# Patient Record
Sex: Male | Born: 1965 | Race: White | Hispanic: No | Marital: Married
Health system: Midwestern US, Community
[De-identification: ages and names within clinical notes are randomized; demographics above are authoritative.]

## PROBLEM LIST (undated history)

## (undated) DIAGNOSIS — E119 Type 2 diabetes mellitus without complications: Secondary | ICD-10-CM

## (undated) DIAGNOSIS — I4891 Unspecified atrial fibrillation: Secondary | ICD-10-CM

## (undated) DIAGNOSIS — L719 Rosacea, unspecified: Secondary | ICD-10-CM

## (undated) DIAGNOSIS — G932 Benign intracranial hypertension: Secondary | ICD-10-CM

## (undated) DIAGNOSIS — I10 Essential (primary) hypertension: Secondary | ICD-10-CM

## (undated) DIAGNOSIS — E1165 Type 2 diabetes mellitus with hyperglycemia: Principal | ICD-10-CM

## (undated) DIAGNOSIS — I480 Paroxysmal atrial fibrillation: Secondary | ICD-10-CM

## (undated) DIAGNOSIS — Z7901 Long term (current) use of anticoagulants: Principal | ICD-10-CM

---

## 2016-02-18 ENCOUNTER — Emergency Department (HOSPITAL_COMMUNITY): Payer: Self-pay

## 2016-02-18 ENCOUNTER — Observation Stay (HOSPITAL_COMMUNITY)
Admission: EM | Admit: 2016-02-18 | Discharge: 2016-02-18 | Disposition: A | Discharge status: Home / Self Care | Payer: Self-pay | Attending: Internal Medicine | Admitting: Internal Medicine

## 2016-02-18 ENCOUNTER — Encounter (HOSPITAL_COMMUNITY): Payer: Self-pay | Admitting: Emergency Medicine

## 2016-02-18 DIAGNOSIS — J810 Acute pulmonary edema: Secondary | ICD-10-CM

## 2016-02-18 DIAGNOSIS — Z7984 Long term (current) use of oral hypoglycemic drugs: Secondary | ICD-10-CM | POA: Insufficient documentation

## 2016-02-18 DIAGNOSIS — D72829 Elevated white blood cell count, unspecified: Secondary | ICD-10-CM | POA: Insufficient documentation

## 2016-02-18 DIAGNOSIS — E119 Type 2 diabetes mellitus without complications: Secondary | ICD-10-CM | POA: Insufficient documentation

## 2016-02-18 DIAGNOSIS — Z6830 Body mass index (BMI) 30.0-30.9, adult: Secondary | ICD-10-CM | POA: Insufficient documentation

## 2016-02-18 DIAGNOSIS — I480 Paroxysmal atrial fibrillation: Principal | ICD-10-CM | POA: Insufficient documentation

## 2016-02-18 DIAGNOSIS — F419 Anxiety disorder, unspecified: Secondary | ICD-10-CM | POA: Insufficient documentation

## 2016-02-18 DIAGNOSIS — Z7901 Long term (current) use of anticoagulants: Secondary | ICD-10-CM | POA: Insufficient documentation

## 2016-02-18 DIAGNOSIS — Z79899 Other long term (current) drug therapy: Secondary | ICD-10-CM | POA: Insufficient documentation

## 2016-02-18 DIAGNOSIS — E669 Obesity, unspecified: Secondary | ICD-10-CM | POA: Insufficient documentation

## 2016-02-18 DIAGNOSIS — I10 Essential (primary) hypertension: Secondary | ICD-10-CM | POA: Diagnosis present

## 2016-02-18 DIAGNOSIS — I4891 Unspecified atrial fibrillation: Secondary | ICD-10-CM

## 2016-02-18 HISTORY — DX: Rosacea, unspecified: L71.9

## 2016-02-18 HISTORY — DX: Essential (primary) hypertension: I10

## 2016-02-18 HISTORY — DX: Type 2 diabetes mellitus without complications: E11.9

## 2016-02-18 HISTORY — DX: Unspecified atrial fibrillation: I48.91

## 2016-02-18 HISTORY — DX: Benign intracranial hypertension: G93.2

## 2016-02-18 LAB — I-STAT CHEM 8, ED
BUN: 23 mg/dL — ABNORMAL HIGH (ref 6–20)
CHLORIDE: 103 mmol/L (ref 101–111)
Calcium, Ion: 1.16 mmol/L (ref 1.15–1.40)
Creatinine, Ser: 0.9 mg/dL (ref 0.61–1.24)
GLUCOSE: 212 mg/dL — AB (ref 65–99)
HCT: 46 % (ref 39.0–52.0)
Hemoglobin: 15.6 g/dL (ref 13.0–17.0)
POTASSIUM: 4.3 mmol/L (ref 3.5–5.1)
Sodium: 138 mmol/L (ref 135–145)
TCO2: 25 mmol/L (ref 0–100)

## 2016-02-18 LAB — CBC WITH DIFFERENTIAL/PLATELET
Basophils Absolute: 0 10*3/uL (ref 0.0–0.1)
Basophils Relative: 0 %
EOS PCT: 1 %
Eosinophils Absolute: 0.1 10*3/uL (ref 0.0–0.7)
HEMATOCRIT: 43.2 % (ref 39.0–52.0)
HEMOGLOBIN: 15.3 g/dL (ref 13.0–17.0)
LYMPHS PCT: 40 %
Lymphs Abs: 5.2 10*3/uL — ABNORMAL HIGH (ref 0.7–4.0)
MCH: 28.7 pg (ref 26.0–34.0)
MCHC: 35.4 g/dL (ref 30.0–36.0)
MCV: 80.9 fL (ref 78.0–100.0)
MONO ABS: 1 10*3/uL (ref 0.1–1.0)
MONOS PCT: 8 %
NEUTROS PCT: 51 %
Neutro Abs: 6.6 10*3/uL (ref 1.7–7.7)
PLATELETS: 320 10*3/uL (ref 150–400)
RBC: 5.34 MIL/uL (ref 4.22–5.81)
RDW: 13.9 % (ref 11.5–15.5)
WBC: 12.9 10*3/uL — AB (ref 4.0–10.5)

## 2016-02-18 LAB — URINALYSIS, ROUTINE W REFLEX MICROSCOPIC
Bilirubin Urine: NEGATIVE
GLUCOSE, UA: NEGATIVE mg/dL
HGB URINE DIPSTICK: NEGATIVE
KETONES UR: NEGATIVE mg/dL
LEUKOCYTES UA: NEGATIVE
Nitrite: NEGATIVE
PROTEIN: NEGATIVE mg/dL
Specific Gravity, Urine: 1.007 (ref 1.005–1.030)
pH: 5 (ref 5.0–8.0)

## 2016-02-18 LAB — BRAIN NATRIURETIC PEPTIDE: B NATRIURETIC PEPTIDE 5: 41.5 pg/mL (ref 0.0–100.0)

## 2016-02-18 LAB — I-STAT TROPONIN, ED: TROPONIN I, POC: 0 ng/mL (ref 0.00–0.08)

## 2016-02-18 LAB — PROTIME-INR
INR: 0.92
Prothrombin Time: 12.3 seconds (ref 11.4–15.2)

## 2016-02-18 LAB — GLUCOSE, CAPILLARY: GLUCOSE-CAPILLARY: 215 mg/dL — AB (ref 65–99)

## 2016-02-18 MED ORDER — DOXYCYCLINE HYCLATE 100 MG PO TABS: 100.0000 mg | ORAL_TABLET | Freq: Two times a day (BID) | ORAL | 0 refills | Status: AC

## 2016-02-18 MED ORDER — LEVOFLOXACIN IN D5W 500 MG/100ML IV SOLN
500.0000 mg | Freq: Once | INTRAVENOUS | Status: AC
  Administered 2016-02-18: 500 mg via INTRAVENOUS
  Filled 2016-02-18: qty 100

## 2016-02-18 MED ORDER — FUROSEMIDE 10 MG/ML IJ SOLN
40.0000 mg | Freq: Once | INTRAMUSCULAR | Status: AC
  Administered 2016-02-18: 40 mg via INTRAVENOUS
  Filled 2016-02-18: qty 4

## 2016-02-18 MED ORDER — INSULIN ASPART 100 UNIT/ML ~~LOC~~ SOLN
0.0000 [IU] | Freq: Three times a day (TID) | SUBCUTANEOUS | Status: DC
  Administered 2016-02-18: 5 [IU] via SUBCUTANEOUS

## 2016-02-18 MED ORDER — SERTRALINE HCL 50 MG PO TABS: 50.0000 mg | ORAL_TABLET | Freq: Every day | ORAL | Status: DC

## 2016-02-18 MED ORDER — DILTIAZEM HCL-DEXTROSE 100-5 MG/100ML-% IV SOLN (PREMIX)
5.0000 mg/h | INTRAVENOUS | Status: DC
  Administered 2016-02-18: 5 mg/h via INTRAVENOUS
  Filled 2016-02-18: qty 100

## 2016-02-18 MED ORDER — METOPROLOL TARTRATE 5 MG/5ML IV SOLN
2.5000 mg | INTRAVENOUS | Status: AC
  Administered 2016-02-18 (×4): 2.5 mg via INTRAVENOUS
  Filled 2016-02-18 (×2): qty 5

## 2016-02-18 MED ORDER — INSULIN ASPART 100 UNIT/ML ~~LOC~~ SOLN: 0.0000 [IU] | Freq: Every day | SUBCUTANEOUS | Status: DC

## 2016-02-18 MED ORDER — METOPROLOL SUCCINATE ER 100 MG PO TB24
100.0000 mg | ORAL_TABLET | Freq: Every day | ORAL | Status: DC
  Administered 2016-02-18: 100 mg via ORAL
  Filled 2016-02-18: qty 1

## 2016-02-18 MED ORDER — DOXYCYCLINE HYCLATE 100 MG PO TABS
100.0000 mg | ORAL_TABLET | Freq: Two times a day (BID) | ORAL | Status: DC
  Administered 2016-02-18: 100 mg via ORAL
  Filled 2016-02-18: qty 1

## 2016-02-18 MED ORDER — SODIUM CHLORIDE 0.9 % IV BOLUS (SEPSIS)
1000.0000 mL | Freq: Once | INTRAVENOUS | Status: AC
  Administered 2016-02-18: 1000 mL via INTRAVENOUS

## 2016-02-18 MED ORDER — APIXABAN 5 MG PO TABS
5.0000 mg | ORAL_TABLET | Freq: Two times a day (BID) | ORAL | Status: DC
  Administered 2016-02-18: 5 mg via ORAL
  Filled 2016-02-18: qty 1

## 2016-02-18 MED ORDER — DILTIAZEM LOAD VIA INFUSION
15.0000 mg | Freq: Once | INTRAVENOUS | Status: AC
  Administered 2016-02-18: 15 mg via INTRAVENOUS
  Filled 2016-02-18: qty 15

## 2016-02-18 MED ORDER — LISINOPRIL 10 MG PO TABS
10.0000 mg | ORAL_TABLET | Freq: Every day | ORAL | Status: DC
  Administered 2016-02-18: 10 mg via ORAL
  Filled 2016-02-18: qty 1

## 2016-02-18 MED ORDER — METOPROLOL SUCCINATE ER 25 MG PO TB24: 25.0000 mg | ORAL_TABLET | Freq: Every day | ORAL | 0 refills | Status: AC

## 2016-02-18 MED ORDER — BUPROPION HCL ER (XL) 150 MG PO TB24
150.0000 mg | ORAL_TABLET | Freq: Every day | ORAL | Status: DC
  Administered 2016-02-18: 150 mg via ORAL
  Filled 2016-02-18: qty 1

## 2016-02-18 NOTE — ED Provider Notes (Addendum)
[facility]-EMERGENCY DEPT Provider Note   CSN: [id number] Arrival date & time: 02/18/16  0246  By signing my name below, I, Majel Homer, attest that this documentation has been prepared under the direction and in the presence of Shauntell Iglesia, MD . Electronically Signed: Majel Homer, Scribe. 02/18/2016. 3:03 AM.  History   Chief Complaint Chief Complaint  Patient presents with  . Atrial Fibrillation   The history is provided by the patient. No language interpreter was used.   HPI Comments: Dalton Morrison is a 50 y.o. male with PMHx of A-Fib who presents to the Emergency Department complaining of gradually worsening, sensation of increased heart rate that began ~2 hours PTA. Pt reports he arrived on vacation from [place] last night and began to experience palpitations this evening. He notes he has been hospitalized for similar episodes in the past and was usually travelling on vacation during onset. He states this has happened in the past 4 months. He notes a recent episode was triggered by EtOH but he denies drinking tonight.   No past medical history on file.  There are no active problems to display for this patient.  No past surgical history on file.  Irregularly irregular heart beat  Tachycardia   Home Medications    Prior to Admission medications   Not on File    Family History No family history on file.  Social History Social History  Substance Use Topics  . Smoking status: Not on file  . Smokeless tobacco: Not on file  . Alcohol use Not on file   Allergies   Review of patient's allergies indicates not on file.  Review of Systems Review of Systems  Constitutional: Negative for fever.  Cardiovascular: Positive for palpitations.  All other systems reviewed and are negative.  Physical Exam Updated Vital Signs BP 94/70 (BP Location: Right Arm) Comment: RN aware  Pulse (!) 151   Temp 98.2 F (36.8 C) (Oral)   Resp 20   Ht 5\' 9"  (1.753 m)   Wt 227 lb (103 kg)    SpO2 100%   BMI 33.52 kg/m   Physical Exam  Constitutional: He appears well-developed and well-nourished.  HENT:  Head: Normocephalic and atraumatic.  Mouth/Throat: Oropharynx is clear and moist. No oropharyngeal exudate.  Eyes: Conjunctivae and EOM are normal. Pupils are equal, round, and reactive to light. Right eye exhibits no discharge. Left eye exhibits no discharge. No scleral icterus.  Neck: Normal range of motion. Neck supple. No JVD present. No tracheal deviation present.  Trachea is midline. No stridor or carotid bruits.  Cardiovascular: Intact distal pulses.  An irregularly irregular rhythm present. Tachycardia present.   No murmur heard. Irregularly irregular heart rate  Pulmonary/Chest: Effort normal and breath sounds normal. No accessory muscle usage or stridor. No respiratory distress. He has no wheezes. He has no rales.  Abdominal: Soft. Bowel sounds are normal. He exhibits no distension. There is no tenderness. There is no rebound and no guarding.  Musculoskeletal: Normal range of motion. He exhibits no edema or tenderness.  Lymphadenopathy:    He has no cervical adenopathy.  Neurological: He is alert. He has normal reflexes.  Skin: Skin is warm and dry. Capillary refill takes less than 2 seconds. He is not diaphoretic.  Psychiatric: He has a normal mood and affect. His behavior is normal.  Nursing note and vitals reviewed.  ED Treatments / Results  Labs (all labs ordered are listed, but only abnormal results are displayed) Labs Reviewed - No data  to display  EKG  EKG Interpretation None       Radiology Dg Chest [facility] 1 View  Result Date: 02/18/2016 CLINICAL DATA:  Heart palpitations, atrial fibrillation EXAM: PORTABLE CHEST 1 VIEW COMPARISON:  None. FINDINGS: There is shallow lung inflation. Costophrenic angles are not completely visualized, but there is no sizable pleural effusion. No pneumothorax. Cardiac silhouette appears mildly enlarged, though this may  be exaggerated by AP technique. There is mild pulmonary edema. There is a region of more focal consolidation within the medial right upper lobe. IMPRESSION: 1. Mild cardiomegaly with mild pulmonary edema. 2. More focal region of consolidation in the right upper lobe, which may indicate atelectasis or developing infection. Electronically Signed   By: Deatra Robinson M.D.   On: 02/18/2016 03:44   Procedures Procedures  DIAGNOSTIC STUDIES:  Oxygen Saturation is 100% on RA, normal by my interpretation.    COORDINATION OF CARE:  3:01 AM Discussed treatment plan with pt at bedside and pt agreed to plan.  Medications Ordered in ED Medications - No data to display   Initial Impression / Assessment and Plan / ED Course  I have reviewed the triage vital signs and the nursing notes.  Pertinent labs & imaging results that were available during my care of the patient were reviewed by me and considered in my medical decision making (see chart for details).  Clinical Course   Vitals:   02/18/16 0300  BP: 94/70  Pulse: (!) 151  Resp: 20  Temp: 98.2 F (36.8 C)    I personally performed the services described in this documentation, which was scribed in my presence. The recorded information has been reviewed and is accurate.   Final Clinical Impressions(s) / ED Diagnoses   Final diagnoses:  None    New Prescriptions New Prescriptions   No medications on file  ED ECG REPORT   Date: 02/18/2016  Rate: 156  Rhythm: atrial fibrillation  QRS Axis: normal  Intervals: normal  ST/T Wave abnormalities: normal  Conduction Disutrbances:none  Narrative Interpretation:   Old EKG Reviewed: none available Results for orders placed or performed during the hospital encounter of 02/18/16  CBC with Differential/Platelet  Result Value Ref Range   WBC 12.9 (H) 4.0 - 10.5 K/uL   RBC 5.34 4.22 - 5.81 MIL/uL   Hemoglobin 15.3 13.0 - 17.0 g/dL   HCT 81.1 91.4 - 78.2 %   MCV 80.9 78.0 - 100.0 fL   MCH  28.7 26.0 - 34.0 pg   MCHC 35.4 30.0 - 36.0 g/dL   RDW 95.6 21.3 - 08.6 %   Platelets 320 150 - 400 K/uL   Neutrophils Relative % 51 %   Lymphocytes Relative 40 %   Monocytes Relative 8 %   Eosinophils Relative 1 %   Basophils Relative 0 %   Neutro Abs 6.6 1.7 - 7.7 K/uL   Lymphs Abs 5.2 (H) 0.7 - 4.0 K/uL   Monocytes Absolute 1.0 0.1 - 1.0 K/uL   Eosinophils Absolute 0.1 0.0 - 0.7 K/uL   Basophils Absolute 0.0 0.0 - 0.1 K/uL   Smear Review MORPHOLOGY UNREMARKABLE   Protime-INR  Result Value Ref Range   Prothrombin Time 12.3 11.4 - 15.2 seconds   INR 0.92   I-Stat Chem 8, ED  Result Value Ref Range   Sodium 138 135 - 145 mmol/L   Potassium 4.3 3.5 - 5.1 mmol/L   Chloride 103 101 - 111 mmol/L   BUN 23 (H) 6 - 20 mg/dL  Creatinine, Ser 0.90 0.61 - 1.24 mg/dL   Glucose, Bld 295212 (H) 65 - 99 mg/dL   Calcium, Ion 2.841.16 1.321.15 - 1.40 mmol/L   TCO2 25 0 - 100 mmol/L   Hemoglobin 15.6 13.0 - 17.0 g/dL   HCT 44.046.0 10.239.0 - 72.552.0 %  I-stat troponin, ED  Result Value Ref Range   Troponin i, poc 0.00 0.00 - 0.08 ng/mL   Comment 3           Dg Chest Port 1 View  Result Date: 02/18/2016 CLINICAL DATA:  Heart palpitations, atrial fibrillation EXAM: PORTABLE CHEST 1 VIEW COMPARISON:  None. FINDINGS: There is shallow lung inflation. Costophrenic angles are not completely visualized, but there is no sizable pleural effusion. No pneumothorax. Cardiac silhouette appears mildly enlarged, though this may be exaggerated by AP technique. There is mild pulmonary edema. There is a region of more focal consolidation within the medial right upper lobe. IMPRESSION: 1. Mild cardiomegaly with mild pulmonary edema. 2. More focal region of consolidation in the right upper lobe, which may indicate atelectasis or developing infection. Electronically Signed   By: Deatra RobinsonKevin  Herman M.D.   On: 02/18/2016 03:44   Medications  diltiazem (CARDIZEM) 1 mg/mL load via infusion 15 mg (15 mg Intravenous Bolus from Bag 02/18/16 0329)     And  diltiazem (CARDIZEM) 100 mg in dextrose 5% 100mL (1 mg/mL) infusion (7.5 mg/hr Intravenous Rate/Dose Change 02/18/16 0401)  levofloxacin (LEVAQUIN) IVPB 500 mg (500 mg Intravenous New Bag/Given 02/18/16 0426)  sodium chloride 0.9 % bolus 1,000 mL (1,000 mLs Intravenous Rate/Dose Change 02/18/16 0403)  furosemide (LASIX) injection 40 mg (40 mg Intravenous Given 02/18/16 0413)   MDM Reviewed: vitals Interpretation: labs, ECG and x-ray (CHF by me on EKG, negative troponin by me elevated WBC by me) Total time providing critical care: 75-105 minutes. This excludes time spent performing separately reportable procedures and services. Consults: admitting MD  CRITICAL CARE Performed by: Jasmine AwePALUMBO-RASCH,Jeancarlo Leffler K Total critical care time: 90 minutes Critical care time was exclusive of separately billable procedures and treating other patients. Critical care was necessary to treat or prevent imminent or life-threatening deterioration. Critical care was time spent personally by me on the following activities: development of treatment plan with patient and/or surrogate as well as nursing, discussions with consultants, evaluation of patient's response to treatment, examination of patient, obtaining history from patient or surrogate, ordering and performing treatments and interventions, ordering and review of laboratory studies, ordering and review of radiographic studies, pulse oximetry and re-evaluation of patient's condition.  Will need admission as is diltiazem dependent. IVF was stopped, bolus was not completed.  Has been traveling around the country by plane.  Will need admission   Lametria Klunk, MD 02/18/16 [id number]    Cy BlamerApril Nawaf Strange, MD 02/18/16 [phone]

## 2016-02-18 NOTE — Progress Notes (Signed)
Completed D/C teaching. Gave prescriptions. Answered questions. Patient will be D/C home with family in stable condition. 

## 2016-02-18 NOTE — H&P (Signed)
History and Physical  Patient Name: Dalton Morrison     ZDG:[id number]    DOB: 1966/02/01    DOA: 02/18/2016 PCP: Fabiola Backer, MD, MD   Patient coming from: [facility]  Chief Complaint: Fast heart rate  HPI: Dalton Morrison is a 50 y.o. male with a past medical history significant for parox Afib on Eliquis, NIDDM, and HTN who presents with fast heart rate.  The patient lives in [place], [place] but travels frequently for work.  He has had atrial fibrillation paroxysmally over the last 2 years, is on Eliquis and metoprolol XL 50 mg daily.  During his last two episodes of tachycardia, both seemed to be precipitated by alcohol, and both times his rhythm has returned spontaneously to sinus rhythm before he was seen by an ER provider.    Tonight, he was at his hotel, had had no alcohol, and started to feel palpitations/heart racing.  He had no chest pain, no shortness of breath, no lightheadedness, and came to the ER.  ED course: -Afebrile, heart rate 150s, respirations normal, blood pressure initially 94/70, pulse oximetry normal -Na 138, K 4.3, Cr 0.9 (baseline per care everywhere), WBC 12.9 K, Hgb 15 -ECG showed Afib with rate 150s -BNP normal, troponin negative -Chest x-ray showed a questionable right upper lobe infiltrate, he denied cough, sputum, dyspnea, chest congestion -He did note a recent episode of urinary frequency and flank pain that resolved by itself 2 weeks ago, and then recurrence of flank pain and urinary irritative symptoms in the last 24 hours -Diltiazem IV was started and his heart rate improved to 100s and 110s and his low blood pressure resolved         ROS: Review of Systems  Constitutional: Negative for chills, diaphoresis, fever and malaise/fatigue.  Respiratory: Negative for cough, hemoptysis, sputum production and shortness of breath.   Cardiovascular: Positive for palpitations. Negative for chest pain and leg swelling.  Gastrointestinal: Negative for abdominal pain,  diarrhea, nausea and vomiting.  Genitourinary: Positive for dysuria, flank pain and frequency. Negative for hematuria and urgency.  All other systems reviewed and are negative.         Past Medical History:  Diagnosis Date  . A-fib (HCC)   . Acne rosacea   . Benign intracranial hypertension   . Diabetes mellitus without complication (HCC)   . Hypertension     History reviewed. No pertinent surgical history.  Social History: Patient lives with his wife in [place].  The patient walks unassisted.  He does smoke occasionally.  He uses alcohol rarely.    Allergies  Allergen Reactions  . Penicillins Hives and Other (See Comments)    Has patient had a PCN reaction causing immediate rash, facial/tongue/throat swelling, SOB or lightheadedness with hypotension: No Has patient had a PCN reaction causing severe rash involving mucus membranes or skin necrosis: No Has patient had a PCN reaction that required hospitalization No Has patient had a PCN reaction occurring within the last 10 years: No If all of the above answers are "NO", then may proceed with Cephalosporin use.    Family history: family history includes Alcohol abuse in his mother; Arthritis in his father; COPD in his mother; Depression in his mother; Diabetes in his father; Heart disease in his father; Hypertension in his father; Stroke in his father.  Prior to Admission medications   Medication Sig Start Date End Date Taking? Authorizing Provider  apixaban (ELIQUIS) 5 MG TABS tablet Take 5 mg by mouth 2 (two) times daily.  Yes Historical Provider, MD  buPROPion (WELLBUTRIN XL) 150 MG 24 hr tablet Take 150 mg by mouth daily.   Yes Historical Provider, MD  Liraglutide (VICTOZA) 18 MG/3ML SOPN Inject 1.8 mg into the skin daily.   Yes Historical Provider, MD  lisinopril (PRINIVIL,ZESTRIL) 10 MG tablet Take 10 mg by mouth daily.   Yes Historical Provider, MD  metFORMIN (GLUCOPHAGE-XR) 500 MG 24 hr tablet Take 500-1,000 mg by  mouth 2 (two) times daily. Pt takes two tablets in the morning and one at night.   Yes Historical Provider, MD  metoprolol succinate (TOPROL-XL) 50 MG 24 hr tablet Take 50 mg by mouth at bedtime. Take with or immediately following a meal.   Yes Historical Provider, MD  omeprazole (PRILOSEC) 20 MG capsule Take 20 mg by mouth daily.   Yes Historical Provider, MD  pravastatin (PRAVACHOL) 40 MG tablet Take 40 mg by mouth at bedtime.   Yes Historical Provider, MD  sertraline (ZOLOFT) 50 MG tablet Take 50 mg by mouth at bedtime.   Yes Historical Provider, MD       Physical Exam: BP 113/79   Pulse 103   Temp 98.2 F (36.8 C) (Oral)   Resp 11   Ht 5\' 9"  (1.753 m)   Wt 103 kg (227 lb)   SpO2 95%   BMI 33.52 kg/m  General appearance: Well-developed, adult male, alert and in no acute distress.   Eyes: Anicteric, conjunctiva pink, lids and lashes normal.     ENT: No nasal deformity, discharge, or epistaxis.  OP moist without lesions.   Lymph: No cervical or supraclavicular lymphadenopathy. Skin: Warm and dry.  No jaundice.  No suspicious rashes or lesions. Cardiac: RRR, nl S1-S2, no murmurs appreciated.  Capillary refill is brisk.  JVP normal.  No LE edema.  Radial and DP pulses 2+ and symmetric. Respiratory: Normal respiratory rate and rhythm.  CTAB without rales or wheezes. GI: Abdomen soft without rigidity.  No TTP. No ascites, distension, hepatosplenomegaly.   MSK: No deformities or effusions.  No clubbing/cyanosis. Neuro: Cranial nerves normal.  Sensorium intact and responding to questions, attention normal.  Speech is fluent.  Moves all extremities equally and with normal coordination.    Psych: Affect normal.  Judgment and insight appear normal.       Labs on Admission:  I have personally reviewed following labs and imaging studies: CBC:  Recent Labs Lab 02/18/16 0309 02/18/16 0322  WBC 12.9*  --   NEUTROABS 6.6  --   HGB 15.3 15.6  HCT 43.2 46.0  MCV 80.9  --   PLT 320  --     Basic Metabolic Panel:  Recent Labs Lab 02/18/16 0322  NA 138  K 4.3  CL 103  GLUCOSE 212*  BUN 23*  CREATININE 0.90   GFR: Estimated Creatinine Clearance: 116.1 mL/min (by C-G formula based on SCr of 0.9 mg/dL).  Coagulation Profile:  Recent Labs Lab 02/18/16 0309  INR 0.92        Radiological Exams on Admission: Personally reviewed: Dg Chest Port 1 View  Result Date: 02/18/2016 CLINICAL DATA:  Heart palpitations, atrial fibrillation EXAM: PORTABLE CHEST 1 VIEW COMPARISON:  None. FINDINGS: There is shallow lung inflation. Costophrenic angles are not completely visualized, but there is no sizable pleural effusion. No pneumothorax. Cardiac silhouette appears mildly enlarged, though this may be exaggerated by AP technique. There is mild pulmonary edema. There is a region of more focal consolidation within the medial right upper lobe. IMPRESSION: 1.  Mild cardiomegaly with mild pulmonary edema. 2. More focal region of consolidation in the right upper lobe, which may indicate atelectasis or developing infection. Electronically Signed   By: Deatra RobinsonKevin  Herman M.D.   On: 02/18/2016 03:44    EKG: Independently reviewed. Rate 156, atrial fibrillation, no significant ST depressions.    Assessment/Plan 1. pAF with RVR:  CHADS2Vasc 2 on Eliquis and metoprolol.  Follow[place] by [place]CP in [place].   -Metoprolol IV now -Repeat dose this morning until rate controlled -Plan to increase metoprolol to 100 mg daily at 10a today -Continue Eliquis   2. Leukocytosis: Unclear etiology.  No fever, cough, dyspnea, sputum.  No clinical findings to correlate with CXR, pneumonia doubted.  Levofloxacin discontinued.  I do not believe at present that the patient has sepsis. -Check UA given urinary frequency symptoms, culture if pyuria and start oral antibiotics for UTI  NIDDM:  -Hold Victoza and metformin -SSI with meals  3. HTN:  -Continue lisinopril  4. Anxiety:  -Continue Zoloft and  bupropion  5. Other medications: -Hold nonessential statin and PPI    DVT prophylaxis: On Eliquis  Code Status: FULL  Family Communication: None present  Disposition Plan: Anticipate IV metoprolol for rate control and then titration of oral metoprolol and dsicharge Consults called: None Admission status: OBS, tele At the point of initial evaluation, it is my clinical opinion that admission for OBSERVATION is reasonable and necessary because the patient's presenting complaints in the context of their chronic conditions represent sufficient risk of deterioration or significant morbidity to constitute reasonable grounds for close observation in the hospital setting, but that the patient may be medically stable for discharge from the hospital within 24 to 48 hours.    Medical decision making: Patient seen at 5:20 AM on 02/18/2016.  The patient was discussed with Dr. Nicanor AlconPalumbo. What exists of the patient's chart and [facility] were [facility].  Clinical condition: stable.        Alberteen SamChristopher P Kiyomi Pallo [facility] Pager [phone]

## 2016-02-18 NOTE — ED Triage Notes (Signed)
Pt states that he began having increased heart rate for the last 2 hrs; pt states that this has happened 4 times in the last 6 months

## 2016-02-18 NOTE — Discharge Summary (Signed)
Physician Discharge Summary  Dalton Morrison ZOX:[id number] DOB: 06-25-1965 DOA: 02/18/2016  PCP: Fabiola Backer, MD, MD  Admit date: 02/18/2016 Discharge date: 02/18/2016  Recommendations for Outpatient Follow-up:  1. Pt will need to follow up with PCP in 1-2 weeks post discharge 2. Please obtain BMP to evaluate electrolytes and kidney function 3. Please also check CBC to evaluate Hg and Hct levels 4. Pt advised to see his cardiologist for further evaluation of intermittent a-fib episodes  5. Please note that Metoprolol dose was increased from 50 --> 75 mg PO QD 6. Pt also given scrip for doxycycline to complete therapy for 5 days post discharge  7. This was also discussed with wife over the phone   Discharge Diagnoses:  Principal Problem:   Paroxysmal atrial fibrillation with RVR (HCC) Active Problems:   Controlled type 2 diabetes mellitus without complication, without long-term current use of insulin (HCC)   Anxiety   Essential hypertension   Leukocytosis   Atrial fibrillation (HCC)  Discharge Condition: Stable  Diet recommendation: Heart healthy diet discussed in details   History of present illness:  50 y.o. male with a past medical history significant for parox Afib on Eliquis, NIDDM, and HTN who presents with fast heart rate.  The patient lives in [place], [place] but travels frequently for work.  He has had atrial fibrillation paroxysmally over the last 2 years, is on Eliquis and metoprolol XL 50 mg daily.    Hospital Course:   Assessment/Plan pAF with RVR:  CHADS2Vasc 2 on Eliquis and metoprolol.  Followed by PCP in [place].   - Metoprolol dose increased to 75 mg PO QD - HR has been in 60's this AM  - Continue Eliquis  Leukocytosis: - suspect RUL PNA based on CXR - pt started on Doxycycline and will need to complete therapy upon discharge - UA clear with no evidence of an infectious etiology   NIDDM:  - resume home medical regimen   Essential HTN:   - continue home medical regimen   Obesity  - Body mass index is 30.17 kg/m.   DVT prophylaxis: On Eliquis  Code Status: FULL  Family Communication: Wife over the phone  Disposition Plan: Home   Procedures/Studies: Dg Chest Port 1 View  Result Date: 02/18/2016 CLINICAL DATA:  Heart palpitations, atrial fibrillation EXAM: PORTABLE CHEST 1 VIEW COMPARISON:  None. FINDINGS: There is shallow lung inflation. Costophrenic angles are not completely visualized, but there is no sizable pleural effusion. No pneumothorax. Cardiac silhouette appears mildly enlarged, though this may be exaggerated by AP technique. There is mild pulmonary edema. There is a region of more focal consolidation within the medial right upper lobe. IMPRESSION: 1. Mild cardiomegaly with mild pulmonary edema. 2. More focal region of consolidation in the right upper lobe, which may indicate atelectasis or developing infection. Electronically Signed   By: Deatra Robinson M.D.   On: 02/18/2016 03:44    Discharge Exam: Vitals:   02/18/16 0620 02/18/16 0648  BP: 125/84 119/69  Pulse:  60  Resp: 16 18  Temp:  97.9 F (36.6 C)   Vitals:   02/18/16 0610 02/18/16 0615 02/18/16 0620 02/18/16 0648  BP: 116/80 119/80 125/84 119/69  Pulse:    60  Resp: 15 18 16 18   Temp:    97.9 F (36.6 C)  TempSrc:    Oral  SpO2:    99%  Weight:    92.7 kg (204 lb 5.2 oz)  Height:    5\' 9"  (1.753  m)    General: Pt is alert, follows commands appropriately, not in acute distress Cardiovascular: Irregular rate and rhythm, no rubs, no gallops Respiratory: Clear to auscultation bilaterally, no wheezing, no crackles, no rhonchi   Discharge Instructions  Discharge Instructions    Diet - low sodium heart healthy    Complete by:  As directed   Increase activity slowly    Complete by:  As directed       Medication List    TAKE these medications   buPROPion 150 MG 24 hr tablet Commonly known as:  WELLBUTRIN XL Take 150 mg by mouth  daily.   ELIQUIS 5 MG Tabs tablet Generic drug:  apixaban Take 5 mg by mouth 2 (two) times daily.   lisinopril 10 MG tablet Commonly known as:  PRINIVIL,ZESTRIL Take 10 mg by mouth daily.   metFORMIN 500 MG 24 hr tablet Commonly known as:  GLUCOPHAGE-XR Take 500-1,000 mg by mouth 2 (two) times daily. Pt takes two tablets in the morning and one at night.   metoprolol succinate 50 MG 24 hr tablet Commonly known as:  TOPROL-XL Take 50 mg by mouth at bedtime. Take with or immediately following a meal. What changed:  Another medication with the same name was added. Make sure you understand how and when to take each.   metoprolol succinate 25 MG 24 hr tablet Commonly known as:  TOPROL-XL Take 1 tablet (25 mg total) by mouth daily. Take with or immediately following a meal. What changed:  You were already taking a medication with the same name, and this prescription was added. Make sure you understand how and when to take each.   omeprazole 20 MG capsule Commonly known as:  PRILOSEC Take 20 mg by mouth daily.   pravastatin 40 MG tablet Commonly known as:  PRAVACHOL Take 40 mg by mouth at bedtime.   sertraline 50 MG tablet Commonly known as:  ZOLOFT Take 50 mg by mouth at bedtime.   VICTOZA 18 MG/3ML Sopn Generic drug:  Liraglutide Inject 1.8 mg into the skin daily.      Follow-up Information    Fabiola BackerKaren Elaine Abernathy, MD, MD .   Specialty:  Internal Medicine Contact information: [address] [place] [address] [place] [id number] [phone]            The results of significant diagnostics from this hospitalization (including imaging, microbiology, ancillary and laboratory) are listed below for reference.     Microbiology: No results found for this or any previous visit (from the past 240 hour(s)).   Labs: Basic Metabolic Panel:  Recent Labs Lab 02/18/16 0322  NA 138  K 4.3  CL 103  GLUCOSE 212*  BUN 23*  CREATININE 0.90   CBC:  Recent Labs Lab  02/18/16 0309 02/18/16 0322  WBC 12.9*  --   NEUTROABS 6.6  --   HGB 15.3 15.6  HCT 43.2 46.0  MCV 80.9  --   PLT 320  --    BNP (last 3 results)  Recent Labs  02/18/16 0309  BNP 41.5    CBG:  Recent Labs Lab 02/18/16 0837  GLUCAP 215*   SIGNED: Time coordinating discharge: 30 minutes  Debbora PrestoMAGICK-Maurio Baize, MD  [facility] 02/18/2016, 9:48 AM Pager [phone]  If 7PM-7AM, please contact night-coverage [email or web address] Password [email or web address]

## 2016-02-18 NOTE — Discharge Instructions (Signed)
Atrial Fibrillation °Atrial fibrillation is a type of heartbeat that is irregular or fast (rapid). If you have this condition, your heart keeps quivering in a weird (chaotic) way. This condition can make it so your heart cannot pump blood normally. Having this condition gives a person more risk for stroke, heart failure, and other heart problems. There are different types of atrial fibrillation. Talk with your doctor to learn about the type that you have. °HOME CARE °· Take over-the-counter and prescription medicines only as told by your doctor. °· If your doctor prescribed a blood-thinning medicine, take it exactly as told. Taking too much of it can cause bleeding. If you do not take enough of it, you will not have the protection that you need against stroke and other problems. °· Do not use any tobacco products. These include cigarettes, chewing tobacco, and e-cigarettes. If you need help quitting, ask your doctor. °· If you have apnea (obstructive sleep apnea), manage it as told by your doctor. °· Do not drink alcohol. °· Do not drink beverages that have caffeine. These include coffee, soda, and tea. °· Maintain a healthy weight. Do not use diet pills unless your doctor says they are safe for you. Diet pills may make heart problems worse. °· Follow diet instructions as told by your doctor. °· Exercise regularly as told by your doctor. °· Keep all follow-up visits as told by your doctor. This is important. °GET HELP IF: °· You notice a change in the speed, rhythm, or strength of your heartbeat. °· You are taking a blood-thinning medicine and you notice more bruising. °· You get tired more easily when you move or exercise. °GET HELP RIGHT AWAY IF: °· You have pain in your chest or your belly (abdomen). °· You have sweating or weakness. °· You feel sick to your stomach (nauseous). °· You notice blood in your throw up (vomit), poop (stool), or pee (urine). °· You are short of breath. °· You suddenly have swollen feet  and ankles. °· You feel dizzy. °· Your suddenly get weak or numb in your face, arms, or legs, especially if it happens on one side of your body. °· You have trouble talking, trouble understanding, or both. °· Your face or your eyelid droops on one side. °These symptoms may be an emergency. Do not wait to see if the symptoms will go away. Get medical help right away. Call your local emergency services (911 in the [place] Do not drive yourself to the hospital. °  °This information is not intended to replace advice given to you by your health care provider. Make sure you discuss any questions you have with your health care provider. °  °Document Released: 03/16/2008 Document Revised: 02/26/2015 Document Reviewed: 10/02/2014 °Elsevier Interactive Patient Education ©2016 Elsevier Inc. ° °

## 2016-02-18 NOTE — ED Notes (Signed)
Admitting MD at bedside.

## 2016-03-07 ENCOUNTER — Other Ambulatory Visit: Payer: Self-pay | Admitting: Pain Medicine

## 2018-07-12 NOTE — ED Notes (Signed)
ED Patient Education Note     Patient Education Materials Follows:

## 2018-07-12 NOTE — Discharge Summary (Signed)
ED Clinical Summary                     [facility]  [address]  [place], [place] [address]  [phone]          PERSON INFORMATION  Name: Justin [name], Justin [name] Age:  53 Years DOB: 05-21-1966   Sex: Male Language: English PCP: PCP,  NONE   Marital Status: Married Phone: [phone] Med Service: MED-Medicine   MRN: [id number] Acct# [id number] Arrival: 07/12/2018 15:40:00   Visit Reason: Abdominal pain; ABD PAIN Acuity: 3 LOS: 000 02:16   Address:    [address] [place] [address]   Diagnosis:      Medications:          Medications that have not changed  Other Medications  amiodarone (amiodarone 200 mg oral tablet)   Last Dose:____________________  apixaban (Eliquis 5 mg oral tablet) 1 Tabs Oral (given by mouth) 2 times a day.  Last Dose:____________________  escitalopram (escitalopram 10 mg oral tablet)   Last Dose:____________________  lisinopril (lisinopril 10 mg oral tablet)   Last Dose:____________________  metFORMIN (metFORMIN 500 mg oral tablet, extended release)   Last Dose:____________________  metoprolol (Metoprolol Succinate ER 50 mg oral tablet, extended release)   Last Dose:____________________  pravastatin (pravastatin 40 mg oral tablet)   Last Dose:____________________      Medications Administered During Visit:              Allergies      penicillins (Rash)      Major Tests and Procedures:  The following procedures and tests were performed during your ED visit.  COMMON PROCEDURES%>  COMMON PROCEDURES COMMENTS%>                PROVIDER INFORMATION     Attending Physician:  DEFAULT,  DOCTOR      Admit Doc  DEFAULT,  DOCTOR     Consulting Doc       VITALS INFORMATION  Vital Sign Triage Latest   Temp Oral ORAL_1%> ORAL%>   Temp Temporal TEMPORAL_1%> TEMPORAL%>   Temp Intravascular INTRAVASCULAR_1%> INTRAVASCULAR%>   Temp Axillary AXILLARY_1%> AXILLARY%>   Temp Rectal RECTAL_1%> RECTAL%>   02 Sat 98 % 98 %   Respiratory Rate RATE_1%> RATE%>   Peripheral Pulse Rate  PULSE RATE_1%> PULSE RATE%>   Apical Heart Rate HEART RATE_1%> HEART RATE%>   Blood Pressure BLOOD PRESSURE_1%>/ BLOOD PRESSURE_1%>72 mmHg BLOOD PRESSURE%> / BLOOD PRESSURE%>72 mmHg                 Immunizations      No Immunizations Documented This Visit          DISCHARGE INFORMATION   Discharge Disposition: L Left Without Treatment   Discharge Location:  Left prior to being seen by provider   Discharge Date and Time:  07/12/2018 17:56:00   ED Checkout Date and Time:  07/12/2018 17:56:00     DEPART REASON INCOMPLETE INFORMATION               Depart Action Incomplete Reason   Interactive View/I&O Patient left prior to Dr exam   Diagnosis Patient left prior to Dr exam   Patient Education Patient left prior to Dr exam   Follow-up Patient left prior to Dr exam   Nursing Admit/Discharge/Transfer Summary Patient left prior to Dr exam   Patient Understanding Patient left prior to Dr exam  Problems      No Problems Documented              Smoking Status      4 or less cigarettes(less than 1/4 pack)/day in last 30 days         PATIENT EDUCATION INFORMATION  Instructions:          Follow up:            ED PROVIDER DOCUMENTATION

## 2018-07-12 NOTE — ED Notes (Signed)
 ED Patient Summary             [facility] Emergency Department  [address] [place] [address]  [phone]  Discharge Instructions (Patient)  _______________________________________    Name: Justin [name], Justin [name]  DOB: [birth year] MRN: [id number] FIN: WAM%>[id number]  Reason For Visit: Abdominal pain; ABD PAIN  Final Diagnosis:     Visit Date: 07/12/2018 15:40:00  Address: [address] [place] [facility] [address]  Phone: [phone]    Primary Care Provider:  Name: PCP,  NONE  Phone:      Emergency Department Providers:         Primary Physician:           [facility] would like to thank you for allowing us  to assist you with your healthcare needs. The following includes patient education materials and information regarding your injury/illness.    Follow-up Instructions: You were treated today on an emergency basis, it may be wise to contact your primary care provider to notify them of your visit today. You may have been referred to your regular doctor or a specialist, please follow up as instructed. If your condition worsens or you can't get in to see the doctor, contact the Emergency Department.      Patient Education Materials:        Allergy Info: penicillins    Medication Information:  [facility] Phys[facility] provided you with a complete list of medications post discharge, if you have been instructed to stop taking a medication please ensure you also follow up with this information to your Primary Care Physician. Unless otherwise noted, patient will continue to take medications as prescribed prior to the Emergency Room visit. Any specific questions regarding your chronic medications and dosages should be discussed with your physician(s) and pharmacist.          Medications that have not changed  Other Medications  amiodarone  (amiodarone  200 mg oral tablet)   Last Dose:____________________  apixaban (Eliquis 5 mg oral tablet) 1 Tabs Oral (given by mouth) 2 times a day.  Last  Dose:____________________  escitalopram (escitalopram 10 mg oral tablet)   Last Dose:____________________  lisinopril (lisinopril 10 mg oral tablet)   Last Dose:____________________  metFORMIN  (metFORMIN  500 mg oral tablet, extended release)   Last Dose:____________________  metoprolol  (Metoprolol  Succinate ER 50 mg oral tablet, extended release)   Last Dose:____________________  pravastatin  (pravastatin  40 mg oral tablet)   Last Dose:____________________      Medications Administered During Visit:      Major Tests and Procedures:  The following procedures and tests were performed during your ED visit.  PROCEDURES%>  PROCEDURES COMMENTS%>          ---------------------------------------------------------------------------------------------------------------------  [facility] allows you to manage your health, view your test results, and retrieve your discharge documents from your hospital stay securely and conveniently from your computer.    To begin the enrollment process, visit [email or web address] Click on "Sign up now" under [facility].      Comment:

## 2018-07-12 NOTE — ED Notes (Signed)
ED Triage Note       ED Triage Adult Entered On:  07/12/2018 15:46 EST    Performed On:  07/12/2018 15:42 EST by BELLEW, RN, KRISTY M               Triage   Chief Complaint :   Pt c/o body aches, and abdominal pain, states he had fever last night and today but has not medicated for it.    Numeric Rating Pain Scale :   8   [place] Mode of Arrival :   Private vehicle   Temperature Oral :   37.0 degC(Converted to: 98.6 degF)    Heart Rate Monitored :   75 bpm   Respiratory Rate :   15 br/min   Systolic Blood Pressure :   129 mmHg   Diastolic Blood Pressure :   72 mmHg   SpO2 :   98 %   Oxygen Therapy :   Room air   Patient presentation :   None of the above   Chief Complaint or Presentation suggest infection :   No   Weight Dosing :   91 kg(Converted to: 200 lb 10 oz)    Height :   175 cm(Converted to: 5 ft 9 in)    Body Mass Index Dosing :   30 kg/m2   ED General Section :   Document assessment   Pregnancy Status :   N/A   BELLEW, RN, KRISTY M - 07/12/2018 15:42 EST   DCP GENERIC CODE   Tracking Acuity :   3   Tracking Group :   ED [id number] Tracking Group   [place], RN, Soyla Dryer - 07/12/2018 15:42 EST   ED Allergies Section :   Document assessment   ED Reason for Visit Section :   Document assessment   ED Home Meds Section :   Document assessment   BELLEW, RN, KRISTY M - 07/12/2018 15:42 EST   Allergies   (As Of: 07/12/2018 15:46:47 EST)   Allergies (Active)   penicillins  Estimated Onset Date:   Unspecified ; Reactions:   Rash ; Created By:   Zadie Rhine, RN, KRISTY M; Reaction Status:   Active ; Category:   Drug ; Substance:   penicillins ; Type:   Allergy ; Severity:   Mild ; Updated By:   Zadie Rhine RN, Soyla Dryer; Reviewed Date:   07/12/2018 15:44 EST        Psycho-Social   Last 3 mo, thoughts killing self/others :   Patient denies   BELLEW, RN, Soyla Dryer - 07/12/2018 15:42 EST   ED Home Med List   Medication List   (As Of: 07/12/2018 15:46:47 EST)   Home Meds    pravastatin  :   pravastatin ; Status:   Documented ; Ordered As  Mnemonic:   pravastatin 40 mg oral tablet ; Simple Display Line:   0 Refill(s) ; Ordering Provider:   Rosendo Gros; Catalog Code:   pravastatin ; Order Dt/Tm:   07/12/2018 15:46:00 EST          amiodarone  :   amiodarone ; Status:   Documented ; Ordered As Mnemonic:   amiodarone 200 mg oral tablet ; Simple Display Line:   0 Refill(s) ; Ordering Provider:   Tracey Harries, MD, Elana Alm.; Catalog Code:   amiodarone ; Order Dt/Tm:   07/12/2018 15:46:00 EST          apixaban  :   apixaban ; Status:  Documented ; Ordered As Mnemonic:   Eliquis 5 mg oral tablet ; Simple Display Line:   5 mg, 1 tabs, Oral, BID, 60 tabs, 0 Refill(s) ; Catalog Code:   apixaban ; Order Dt/Tm:   07/12/2018 15:45:54 EST          escitalopram  :   escitalopram ; Status:   Documented ; Ordered As Mnemonic:   escitalopram 10 mg oral tablet ; Simple Display Line:   0 Refill(s) ; Ordering Provider:   Rosendo Gros; Catalog Code:   escitalopram ; Order Dt/Tm:   07/12/2018 15:46:00 EST          metoprolol  :   metoprolol ; Status:   Documented ; Ordered As Mnemonic:   Metoprolol Succinate ER 50 mg oral tablet, extended release ; Simple Display Line:   0 Refill(s) ; Ordering Provider:   Rosendo Gros; Catalog Code:   metoprolol ; Order Dt/Tm:   07/12/2018 15:46:00 EST          lisinopril  :   lisinopril ; Status:   Documented ; Ordered As Mnemonic:   lisinopril 10 mg oral tablet ; Simple Display Line:   0 Refill(s) ; Ordering Provider:   Rosendo Gros; Catalog Code:   lisinopril ; Order Dt/Tm:   07/12/2018 15:46:00 EST          metFORMIN  :   metFORMIN ; Status:   Documented ; Ordered As Mnemonic:   metFORMIN 500 mg oral tablet, extended release ; Simple Display Line:   0 Refill(s) ; Ordering Provider:   Rosendo Gros; Catalog Code:   metFORMIN ; Order Dt/Tm:   07/12/2018 15:46:00 EST            ED Reason for Visit   (As Of: 07/12/2018 15:46:47 EST)   Problems(Active)    Afib (SNOMED  CT  :41740814 )  Name of Problem:   Afib ; Recorder:   BELLEW, RN, KRISTY M; Confirmation:   Confirmed ; Classification:   Patient Stated ; Code:   [id number] ; Contributor System:   Dietitian ; Last Updated:   07/12/2018 15:46 EST ; Life Cycle Date:   07/12/2018 ; Life Cycle Status:   Active ; Vocabulary:   SNOMED CT        Depression (SNOMED CT  :49702637 )  Name of Problem:   Depression ; Recorder:   BELLEW, RN, KRISTY M; Confirmation:   Confirmed ; Classification:   Patient Stated ; Code:   [id number] ; Contributor System:   Dietitian ; Last Updated:   07/12/2018 15:46 EST ; Life Cycle Date:   07/12/2018 ; Life Cycle Status:   Active ; Vocabulary:   SNOMED CT        HLD (hyperlipidemia) (SNOMED CT  :74128786 )  Name of Problem:   HLD (hyperlipidemia) ; Recorder:   BELLEW, RN, KRISTY M; Confirmation:   Confirmed ; Classification:   Patient Stated ; Code:   [id number] ; Contributor System:   PowerChart ; Last Updated:   07/12/2018 15:46 EST ; Life Cycle Date:   07/12/2018 ; Life Cycle Status:   Active ; Vocabulary:   SNOMED CT        HTN (hypertension) (SNOMED CT  :0962836629 )  Name of Problem:   HTN (hypertension) ; Recorder:   BELLEW, RN, KRISTY M; Confirmation:   Confirmed ; Classification:   Patient Stated ; Code:   [id number] ; Contributor System:   Dietitian ; Last  Updated:   07/12/2018 15:46 EST ; Life Cycle Date:   07/12/2018 ; Life Cycle Status:   Active ; Vocabulary:   SNOMED CT          Diagnoses(Active)    Abdominal pain  Date:   07/12/2018 ; Diagnosis Type:   Reason For Visit ; Confirmation:   Complaint of ; Clinical Dx:   Abdominal pain ; Classification:   Medical ; Clinical Service:   Emergency medicine ; Code:   PNED ; Probability:   0 ; Diagnosis Code:   [id number]

## 2018-07-12 NOTE — ED Notes (Signed)
ED Triage Note       ED Secondary Triage Entered On:  07/12/2018 15:47 EST    Performed On:  07/12/2018 15:46 EST by Zadie Rhine, RN, KRISTY M               General Information   Barriers to Learning :   None evident   ED Home Meds Section :   Document assessment   [facility] ED Fall Risk Section :   Document assessment   ED History Section :   Document assessment   Infectious Disease Documentation :   Document assessment   ED Advance Directives Section :   Document assessment   Zadie Rhine, RN, Soyla Dryer - 07/12/2018 15:46 EST   (As Of: 07/12/2018 15:47:23 EST)   Problems(Active)    Afib (SNOMED CT  :09811914 )  Name of Problem:   Afib ; Recorder:   BELLEW, RN, KRISTY M; Confirmation:   Confirmed ; Classification:   Patient Stated ; Code:   [id number] ; Contributor System:   Dietitian ; Last Updated:   07/12/2018 15:46 EST ; Life Cycle Date:   07/12/2018 ; Life Cycle Status:   Active ; Vocabulary:   SNOMED CT        Depression (SNOMED CT  :30865784 )  Name of Problem:   Depression ; Recorder:   BELLEW, RN, KRISTY M; Confirmation:   Confirmed ; Classification:   Patient Stated ; Code:   [id number] ; Contributor System:   Dietitian ; Last Updated:   07/12/2018 15:46 EST ; Life Cycle Date:   07/12/2018 ; Life Cycle Status:   Active ; Vocabulary:   SNOMED CT        HLD (hyperlipidemia) (SNOMED CT  :41324401 )  Name of Problem:   HLD (hyperlipidemia) ; Recorder:   BELLEW, RN, KRISTY M; Confirmation:   Confirmed ; Classification:   Patient Stated ; Code:   [id number] ; Contributor System:   PowerChart ; Last Updated:   07/12/2018 15:46 EST ; Life Cycle Date:   07/12/2018 ; Life Cycle Status:   Active ; Vocabulary:   SNOMED CT        HTN (hypertension) (SNOMED CT  :4403474259 )  Name of Problem:   HTN (hypertension) ; Recorder:   BELLEW, RN, KRISTY M; Confirmation:   Confirmed ; Classification:   Patient Stated ; Code:   [id number] ; Contributor System:   Dietitian ; Last Updated:   07/12/2018 15:46 EST ; Life Cycle Date:   07/12/2018 ; Life Cycle  Status:   Active ; Vocabulary:   SNOMED CT          Diagnoses(Active)    Abdominal pain  Date:   07/12/2018 ; Diagnosis Type:   Reason For Visit ; Confirmation:   Complaint of ; Clinical Dx:   Abdominal pain ; Classification:   Medical ; Clinical Service:   Emergency medicine ; Code:   PNED ; Probability:   0 ; Diagnosis Code:   [id number]             -    Procedure History   (As Of: 07/12/2018 15:47:23 EST)     [facility] Fall Risk Assessment Tool   Hx of falling last 3 months ED Fall :   No   Patient confused or disoriented ED Fall :   No   Patient intoxicated or sedated ED Fall :   No   Patient impaired gait ED Fall :   No   Use a mobility assistance device  ED Fall :   No   Patient altered elimination ED Fall :   No   [facility] ED Fall Score :   0    BELLEW, RN, Soyla Dryer - 07/12/2018 15:46 EST   ED Advance Directive   Advance Directive :   No   BELLEW, RN, KRISTY M - 07/12/2018 15:46 EST   Social History   Social History   (As Of: 07/12/2018 15:47:23 EST)   Tobacco:        Tobacco use: 4 or less cigarettes(less than 1/4 pack)/day in last 30 days.  Cigarettes   (Last Updated: 07/12/2018 15:47:00 EST by BELLEW, RN, KRISTY M)            ID Risk Screen Symptoms   Recent Travel History :   No recent travel   TB Symptom Screen :   No symptoms   C. diff Symptom/History ID :   3 or more loose/watery stools x 24 hrs   BELLEW, RN, KRISTY M - 07/12/2018 15:46 EST   ID C. diff Screen   Clinical signs of C. diff infection ID :   Abdominal tenderness, cramping, distention   BELLEW, RN, KRISTY M - 07/12/2018 15:46 EST

## 2018-11-22 LAB — HEMOGLOBIN A1C
Estimated Avg Glucose, External: 263 mg/dL — ABNORMAL HIGH (ref 77–114)
Hemoglobin A1C, External: 10.8 % — ABNORMAL HIGH (ref 4.3–5.6)

## 2020-10-28 LAB — HEMOGLOBIN A1C
Estimated Avg Glucose, External: 258 mg/dL — ABNORMAL HIGH (ref 77–114)
Hemoglobin A1C, External: 10.6 % — ABNORMAL HIGH (ref 4.3–5.6)

## 2021-10-23 ENCOUNTER — Inpatient Hospital Stay
Admit: 2021-10-23 | Discharge: 2021-10-23 | Disposition: A | Discharge status: Home / Self Care | Attending: Emergency Medicine

## 2021-10-23 DIAGNOSIS — I10 Essential (primary) hypertension: Secondary | ICD-10-CM

## 2021-10-23 NOTE — ED Provider Notes (Signed)
[facility] EMERGENCY DEPT  EMERGENCY DEPARTMENT ENCOUNTER      Pt Name: Justin [name]  MRN: [id number]  Birthdate 10/07/1965  Date of evaluation: 10/23/2021  Provider: Carlota Raspberry, MD    CHIEF COMPLAINT       Chief Complaint   Patient presents with    Hypertension     Pt states his pressure has been high today. Reading at home was 197/85. States he takes his lisinopril at night. A&Ox4         HISTORY OF PRESENT ILLNESS    The history is provided by the patient.     Pleasant 56 year old male with longstanding hypertension presents with elevated blood pressures today. He states he gets a flushed sensation in his face that to him to elevated blood pressures.  He had his lisinopril dose doubled just under 2 weeks ago and is scheduled to follow-up with cardiology next week for reassessment.  No headache, visual change, balance issues, chest pain or dyspnea on effort.  He is able to get through his normal fitness regimen without difficulty.  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  No over-the-counter medications.    Nursing Notes were reviewed.    REVIEW OF SYSTEMS                                                                      Review of Systems    Except as noted above the remainder of the review of systems was reviewed and negative.       PAST MEDICAL HISTORY   History reviewed. No pertinent past medical history.    SURGICAL HISTORY     History reviewed. No pertinent surgical history.    CURRENT MEDICATIONS       Previous Medications    No medications on file       ALLERGIES     Penicillins    FAMILY HISTORY     No family history on file.     SOCIAL HISTORY       Social History     Socioeconomic History    Marital status: Legally Separated     Spouse name: None    Number of children: None    Years of education: None    Highest education level: None       SCREENINGS       PHYSICAL EXAM       ED Triage Vitals [10/23/21 1753]   BP Temp Temp Source Pulse  Respirations SpO2 Height Weight - Scale   (!) 164/86 97.7 F (36.5 C) Oral 60 16 100 % 5\' 9"  (1.753 m) 205 lb (93 kg)       Gen:  Alert, no acute distress or signs of toxicity  VS: Mild hypertension noted  HEENT: Pupils equal and reactive, no gaze preference or nystagmus, mucous membranes moist  Cardiovascular: Regular rate and rhythm  Lungs: No respiratory distress, O2 sat 100% on room air which is normal  Abdomen: Soft, nondistended  Musculoskeletal:  No tenderness or deformity. No range of motion deficit. No visible edema  Neurologic: Alert, normal gait, no focal motor or sensory deficits  Skin: Warm and dry, normal turgor  DIAGNOSTIC RESULTS   Procedures       EKG: All EKG's are interpreted by the Emergency Department Physician who either signs or Co-signs this chart in the absence of a cardiologist.    RADIOLOGY    Non-plain film images such as CT, Ultrasound and MRI are read by the radiologist. Plain radiographic images are visualized and preliminarily interpreted by the emergency physician with the below findings:    Interpretation per the Radiologist below, if available at the time of this note:    No orders to display       LABS:  Labs Reviewed - No data to display    All other labs were within normal range or not returned as of this dictation.    EMERGENCY DEPARTMENT COURSE/REASSESSMENT and MDM:   Medical Decision Making      ED Course as of 10/23/21 1914   Fri Oct 23, 2021   1911 Benign examination.  This is an asymptomatic hypertension and I would not manage this aggressively.  I would not change his current treatment strictly because I do not feel his recent changes being given enough time to determine if this is going to be an appropriate regimen.  I would not want to change his beta-blocker directly because of some heart rate issues in combination with amiodarone.  Scheduled to see his cardiologist next week in follow-up after his recent medication change.  Recommend a daily diary and any alarming  symptomatology he is encouraged to return at once. [RE]      ED Course User Index  [RE] Carlota Raspberry, MD       PROCEDURES            CONSULTS:  None    FINAL IMPRESSION      1. Uncontrolled hypertension          DISPOSITION/PLAN   DISPOSITION Decision To Discharge 10/23/2021 07:07:59 PM      PATIENT REFERRED TO:  Allie Bossier, MD  [address]  [place] [address]  [phone]    In 1 week  As Scheduled      DISCHARGE MEDICATIONS:  New Prescriptions    No medications on file     Controlled Substances Monitoring:     No flowsheet data found.    (Please note that portions of this note were completed with a voice recognition program.  Efforts were made to edit the dictations but occasionally words are mis-transcribed.)    Carlota Raspberry, MD (electronically signed)  Attending Emergency Physician           Carlota Raspberry, MD  10/23/21 [phone]

## 2022-02-28 ENCOUNTER — Inpatient Hospital Stay
Admit: 2022-02-28 | Discharge: 2022-02-28 | Disposition: A | Discharge status: Home / Self Care | Attending: Emergency Medicine

## 2022-02-28 ENCOUNTER — Emergency Department: Admit: 2022-02-28

## 2022-02-28 DIAGNOSIS — R0789 Other chest pain: Secondary | ICD-10-CM

## 2022-02-28 LAB — BASIC METABOLIC PANEL
Anion Gap: 10 mmol/L (ref 2–17)
BUN: 16 mg/dL (ref 6–20)
CO2: 21 mmol/L — ABNORMAL LOW (ref 22–29)
Calcium: 9 mg/dL (ref 8.6–10.0)
Chloride: 106 mmol/L (ref 98–107)
Creatinine: 1.2 mg/dL (ref 0.7–1.3)
Est, Glom Filt Rate: 71 mL/min/1.73m (ref >=60)
Glucose: 304 mg/dL — ABNORMAL HIGH (ref 70–99)
OSMOLALITY CALCULATED: 284 mOsm/kg (ref 270–287)
Sodium: 136 mmol/L (ref 135–145)

## 2022-02-28 LAB — CBC WITH AUTO DIFFERENTIAL
Absolute Baso #: 0 10*3/uL (ref 0.0–0.2)
Absolute Eos #: 0.2 10*3/uL (ref 0.0–0.5)
Absolute Lymph #: 2.3 10*3/uL (ref 1.0–3.2)
Absolute Mono #: 0.7 10*3/uL (ref 0.3–1.0)
Basophils %: 0.5 % (ref 0.0–2.0)
Eosinophils %: 2 % (ref 0.0–7.0)
Hematocrit: 39.6 % (ref 38.0–52.0)
Hemoglobin: 13.5 g/dL (ref 13.0–17.3)
Immature Grans (Abs): 0.02 10*3/uL (ref 0.00–0.06)
Immature Granulocytes: 0.3 % (ref 0.0–0.6)
Lymphocytes: 29.5 % (ref 15.0–45.0)
MCH: 28.3 pg (ref 27.0–34.5)
MCHC: 34.1 g/dL (ref 32.0–36.0)
MCV: 83 fL — ABNORMAL LOW (ref 84.0–100.0)
MPV: 10.3 fL (ref 7.2–13.2)
Monocytes: 9.5 % (ref 4.0–12.0)
NRBC Absolute: 0 10*3/uL (ref 0.000–0.012)
NRBC Automated: 0 % (ref 0.0–0.2)
Neutrophils %: 58.2 % (ref 42.0–74.0)
Neutrophils Absolute: 4.5 10*3/uL (ref 1.6–7.3)
Platelets: 295 10*3/uL (ref 140–440)
RBC: 4.77 x10e6/mcL (ref 4.00–5.60)
RDW: 13.8 % (ref 11.0–16.0)
WBC: 7.7 10*3/uL (ref 3.8–10.6)

## 2022-02-28 LAB — TROPONIN
Troponin T: <0.01 ng/mL (ref 0.000–0.010)
Troponin T: <0.01 ng/mL (ref 0.000–0.010)

## 2022-02-28 LAB — BRAIN NATRIURETIC PEPTIDE: NT Pro-BNP: 342 pg/mL — ABNORMAL HIGH (ref 0–125)

## 2022-02-28 LAB — D-DIMER, QUANTITATIVE: D-Dimer, Quant: 0.66 CD:295178345 — ABNORMAL HIGH (ref 0.19–0.50)

## 2022-02-28 MED ORDER — ASPIRIN 81 MG PO CHEW
81 MG | Freq: Once | ORAL | Status: DC
Start: 2022-02-28 — End: 2022-02-28

## 2022-02-28 MED ORDER — IOPAMIDOL 76 % IV SOLN
76 % | Freq: Once | INTRAVENOUS | Status: AC | PRN
Start: 2022-02-28 — End: 2022-02-28
  Administered 2022-02-28: 15:00:00 100 mL via INTRAVENOUS

## 2022-02-28 MED ORDER — KETOROLAC TROMETHAMINE 15 MG/ML IJ SOLN
15 MG/ML | Freq: Once | INTRAMUSCULAR | Status: AC
Start: 2022-02-28 — End: 2022-02-28
  Administered 2022-02-28: 17:00:00 15 mg via INTRAVENOUS

## 2022-02-28 MED FILL — ASPIRIN LOW STRENGTH 81 MG PO CHEW: 81 MG | ORAL | Qty: 4

## 2022-02-28 MED FILL — KETOROLAC TROMETHAMINE 15 MG/ML IJ SOLN: 15 MG/ML | INTRAMUSCULAR | Qty: 1

## 2022-02-28 NOTE — ED Notes (Signed)
Received report from [place], Charity fundraiser.     [address] [place], [place]  02/28/22 [phone]

## 2022-02-28 NOTE — ED Provider Notes (Signed)
[facility] EMERGENCY DEPT  EMERGENCY DEPARTMENT ENCOUNTER      Pt Name: Justin [name]  MRN: [id number]  Birthdate 18-Sep-1965  Date of evaluation: 02/28/2022  Provider evaluation time: 02/28/22 1751  Provider: Achilles Dunk, MD    CHIEF COMPLAINT       Chief Complaint   Patient presents with    Chest Pain     Pt states he has had chest pain since yesterday that got worse today, states it feels like an "ice pick is stabbing him". Hx of afib and last cath he was 50% blocked unsure what artery. States he is hot and sweaty from just being in the bathtub. States he has not taken his blood thinners in 2 weeks, he did take aspirin today. A&Ox4         HISTORY OF PRESENT ILLNESS    56 year old white male presents emergency room complaining of sharp left-sided chest pain.  Patient states that yesterday he began experiencing intermittent sharp pain in his left chest this morning he continued to have sharp stabbing pain so he came to the emergency room.  Patient denies any headache fever productive cough shortness of breath nausea vomiting diarrhea abdominal pain or any other concerns.          Nursing Notes were reviewed.    REVIEW OF SYSTEMS     Review of Systems   Cardiovascular:  Positive for chest pain.   All other systems reviewed and are negative.      Except as noted above the remainder of the review of systems was reviewed and negative.     PAST MEDICAL HISTORY   No past medical history on file.    SURGICAL HISTORY     No past surgical history on file.    CURRENT MEDICATIONS       Previous Medications    No medications on file       ALLERGIES     Penicillins    FAMILY HISTORY     No family history on file.     SOCIAL HISTORY       Social History     Socioeconomic History    Marital status: Legally Separated       SCREENINGS         Glasgow Coma Scale  Eye Opening: Spontaneous  Best Verbal Response: Oriented  Best Motor Response: Obeys commands  Glasgow Coma Scale Score: 15                     CIWA Assessment  BP: (!)  151/75  Pulse: 51                 PHYSICAL EXAM       ED Triage Vitals   BP Temp Temp Source Pulse Respirations SpO2 Height Weight - Scale   02/28/22 0820 02/28/22 0820 02/28/22 0820 02/28/22 0820 02/28/22 0820 02/28/22 0820 -- 02/28/22 0822   (!) 169/80 98.2 F (36.8 C) Axillary 62 16 100 %  200 lb (90.7 kg)       Physical Exam  Constitutional:       Appearance: Normal appearance. He is normal weight.   HENT:      Head: Normocephalic.      Nose: Nose normal.      Mouth/Throat:      Mouth: Mucous membranes are moist.      Pharynx: Oropharynx is clear.   Eyes:      Extraocular Movements: Extraocular movements intact.  Conjunctiva/sclera: Conjunctivae normal.      Pupils: Pupils are equal, round, and reactive to light.   Cardiovascular:      Rate and Rhythm: Normal rate and regular rhythm.      Pulses: Normal pulses.      Heart sounds: Normal heart sounds.   Pulmonary:      Effort: Pulmonary effort is normal.      Breath sounds: Normal breath sounds.   Abdominal:      General: Abdomen is flat. Bowel sounds are normal.      Palpations: Abdomen is soft.   Musculoskeletal:         General: Normal range of motion.      Cervical back: Normal range of motion and neck supple.   Skin:     General: Skin is warm and dry.      Capillary Refill: Capillary refill takes less than 2 seconds.   Neurological:      General: No focal deficit present.      Mental Status: He is alert and oriented to person, place, and time.   Psychiatric:         Mood and Affect: Mood normal.         Behavior: Behavior normal.         EKG 12 Lead    Date/Time: 02/28/2022 9:00 AM    Performed by: Achilles Dunk, MD  Authorized by: Achilles Dunk, MD    ECG reviewed by ED Physician in the absence of a cardiologist: yes    Interpretation:     Interpretation: non-specific    Rate:     ECG rate:  60    ECG rate assessment: normal    Rhythm:     Rhythm: sinus rhythm    Ectopy:     Ectopy: none    QRS:     QRS axis:  Normal    QRS intervals:   Normal    QRS conduction: RBBB    ST segments:     ST segments:  Normal  T waves:     T waves: normal    Q waves:     Abnormal Q-waves: not present        DIAGNOSTIC RESULTS     EKG: All EKG's are interpreted by the Emergency Department Physician who either signs or Co-signs this chart in the absence of a cardiologist.    RADIOLOGY:   CTA Chest W/WO Contrast PE Eval   Final Result   No acute pulmonary embolus. Probable atelectasis/parenchymal scarring at the    right lung base.      Extensive coronary artery calcifications, more than expected for the patient's    age.      XR CHEST PORTABLE   Final Result      No focal infiltrate or overt pulmonary edema.          LABS:  Labs Reviewed   CBC WITH AUTO DIFFERENTIAL - Abnormal; Notable for the following components:       Result Value    MCV 83.0 (*)     All other components within normal limits   BASIC METABOLIC PANEL - Abnormal; Notable for the following components:    Potassium Hemolyzed (*)     CO2 21 (*)     Glucose 304 (*)     All other components within normal limits   BRAIN NATRIURETIC PEPTIDE - Abnormal; Notable for the following components:    NT Pro-BNP 342 (*)  All other components within normal limits   D-DIMER, QUANTITATIVE - Abnormal; Notable for the following components:    D-Dimer, Quant 0.66 (*)     All other components within normal limits   TROPONIN   TROPONIN       All other labs were within normal range or not returned as of this dictation.    EMERGENCY DEPARTMENT COURSE/REASSESSMENT and MDM:   MDM  Number of Diagnoses or Management Options  Diagnosis management comments: Patient's blood work looks relatively good his D-dimer was slightly elevated.  The CTA of his chest was negative for pulmonary embolism.  A delta troponin was also negative.  Recommend he follow-up with his cardiologist or return to the emergency room if she develops any new or worsening symptoms.             FINAL IMPRESSION      1. Atypical chest pain           DISPOSITION/PLAN   DISPOSITION Decision To Discharge 02/28/2022 01:03:24 PM      PATIENT REFERRED TO:  Allie Bossier, MD  [address]  [address]  [place] [address]  [phone]    In 2 days  As needed      DISCHARGE MEDICATIONS:  New Prescriptions    No medications on file     Controlled Substances Monitoring:          No data to display                (Please note that portions of this note were completed with a voice recognition program.  Efforts were made to edit the dictations but occasionally words are mis-transcribed.)    Achilles Dunk, MD (electronically signed)  Attending Emergency Physician           Achilles Dunk, MD  02/28/22 1304

## 2022-02-28 NOTE — ED Notes (Signed)
Pt. Continues to c/o intermittent sharp/stabbing chest pain in his L chest. Vitals remain WDL.     [address] [place], [place]  02/28/22 [phone]

## 2022-02-28 NOTE — ED Notes (Signed)
Taken to CT     [employer], RN  02/28/22 1054

## 2022-03-02 ENCOUNTER — Encounter

## 2022-03-02 LAB — EKG 12-LEAD
P Axis: 41 degrees
P-R Interval: 156 ms
Q-T Interval: 438 ms
QRS Duration: 154 ms
QTc Calculation (Bazett): 438 ms
R Axis: 72 degrees
T Axis: 33 degrees
Ventricular Rate: 60 {beats}/min

## 2022-03-10 ENCOUNTER — Inpatient Hospital Stay: Admit: 2022-03-10 | Discharge: 2022-03-10

## 2022-03-10 DIAGNOSIS — I480 Paroxysmal atrial fibrillation: Secondary | ICD-10-CM

## 2022-03-10 LAB — PROTIME-INR
INR: 1.3 — ABNORMAL LOW (ref 1.5–3.5)
Protime: 15.5 seconds — ABNORMAL HIGH (ref 11.6–14.5)

## 2022-03-14 ENCOUNTER — Inpatient Hospital Stay: Admit: 2022-03-14 | Discharge: 2022-03-15 | Disposition: A | Discharge status: Home / Self Care

## 2022-03-14 DIAGNOSIS — S61012A Laceration without foreign body of left thumb without damage to nail, initial encounter: Secondary | ICD-10-CM

## 2022-03-14 NOTE — ED Notes (Signed)
Pt has a small laceration to his left index finger, pt concerned due to bleeding being on blood thinners. Pt had a pressure dressing on finger, bandage clean and intact. No additional bleeding at this time.      Diamond Nickel, RN  03/14/22 2141

## 2022-03-14 NOTE — ED Notes (Signed)
Wound cleaned and dressed by Mrs. Jefm Bryant PA.--d/c instructions given by same and pt. Sent out of ER home by her also     Burl Tauzin, RN  03/14/22 2206

## 2022-03-14 NOTE — Discharge Instructions (Signed)
Please keep the wound clean and dry. You may clean with soap and water and then apply thin layer of Neosporin/triple antibiotic ointment to the area. Keep finger covered with dressing/bandage during the day and may let air out at night. Return to the ER in 7 days for suture removal. Return sooner for signs of infection: fever, redness, pus from wound or any concerns.

## 2022-03-14 NOTE — ED Provider Notes (Signed)
[facility] EMERGENCY DEPT  EMERGENCY DEPARTMENT ENCOUNTER      Pt Name: Justin [name]  MRN: [id number]  [place] [birth year]  Date of evaluation: 03/14/2022  Provider: Marga Melnick, PA-C  Provider evaluation time: 03/14/22 2057    CHIEF COMPLAINT       Chief Complaint   Patient presents with    Laceration     Left thumb laceration, takes coumadin.  Presents to triage with dressing in place.  Tetanus up to date.         HISTORY OF PRESENT ILLNESS    The patient is a 56 y/o male with left thumb laceration cut by clean knife when slicing watermelon approximately 3 hours PTA. The patient has a history of atrial fibrillation and takes Coumadin. His last INR was checked last week (1.3). The patient reports continued bleeding despite holding pressure over the wound. He does not suspect foreign body. He denies heavy bleeding, numbness, or tingling. Patient is a type 2 DM. Last tetanus <3 years.    The history is provided by the patient.       Nursing Notes were reviewed.    REVIEW OF SYSTEMS     Review of Systems   Constitutional: Negative.  Negative for chills.   Respiratory: Negative.  Negative for shortness of breath.    Cardiovascular: Negative.  Negative for chest pain.   Skin:         Left thumb laceration   Neurological:  Negative for weakness and numbness.   Hematological:  Bruises/bleeds easily.   All other systems reviewed and are negative.      Except as noted above the remainder of the review of systems was reviewed and negative.     PAST MEDICAL HISTORY   No past medical history on file.    SURGICAL HISTORY     No past surgical history on file.    CURRENT MEDICATIONS       There are no discharge medications for this patient.      ALLERGIES     Penicillins    FAMILY HISTORY     No family history on file.     SOCIAL HISTORY       Social History     Socioeconomic History    Marital status: Legally Separated       SCREENINGS         Glasgow Coma Scale  Eye Opening: Spontaneous  Best Verbal Response:  Oriented  Best Motor Response: Obeys commands  Glasgow Coma Scale Score: 15                     CIWA Assessment  BP: (!) 188/85  Pulse: 65                 PHYSICAL EXAM       ED Triage Vitals [03/14/22 1947]   BP Temp Temp Source Pulse Respirations SpO2 Height Weight - Scale   (!) 188/85 98.3 F (36.8 C) Oral 65 16 99 % 5\' 9"  (1.753 m) 192 lb (87.1 kg)       Physical Exam  Vitals and nursing note reviewed.   Constitutional:       Appearance: Normal appearance. He is not ill-appearing or toxic-appearing.   HENT:      Head: Normocephalic and atraumatic.   Cardiovascular:      Rate and Rhythm: Normal rate and regular rhythm.      Pulses: Normal pulses.   Pulmonary:  Effort: Pulmonary effort is normal.      Breath sounds: Normal breath sounds.   Musculoskeletal:         General: Normal range of motion.      Comments: FROM left thumb flexion, extension, abduction and adduction without deformity. FROM all digits left hand. FROM left wrist.   Skin:     Capillary Refill: Capillary refill takes less than 2 seconds.      Comments: Medial aspect distal phalanx left thumb to medial nail fold 0.5cm irregular SQ laceration with bleeding controlled with pressure. No FB. No arterial, nerve or tendon laceration.    Neurological:      General: No focal deficit present.      Mental Status: He is alert. Mental status is at baseline.      Cranial Nerves: No cranial nerve deficit.      Sensory: No sensory deficit.         Lac Repair    Date/Time: 03/14/2022 9:40 PM    Performed by: Valentina Shaggy, PA-C  Authorized by: Valentina Shaggy, PA-C    Consent:     Consent obtained:  Verbal    Consent given by:  Patient    Risks, benefits, and alternatives were discussed: yes      Risks discussed:  Infection, retained foreign body, poor cosmetic result, tendon damage, nerve damage and vascular damage    Alternatives discussed:  Observation and referral  Universal protocol:     Procedure explained and questions answered to patient or  proxy's satisfaction: yes      Immediately prior to procedure, a time out was called: yes      Patient identity confirmed:  Verbally with patient  Anesthesia:     Anesthesia method:  Nerve block    Block location:  Base left thumb    Block needle gauge:  25 G    Block anesthetic:  Lidocaine 1% w/o epi    Block technique:  Medial and lateral approach    Block injection procedure:  Anatomic landmarks identified, introduced needle, incremental injection, anatomic landmarks palpated and negative aspiration for blood    Block outcome:  Anesthesia achieved  Laceration details:     Location: left thumb.    Length (cm):  0.5    Depth (mm):  2  Pre-procedure details:     Preparation:  Patient was prepped and draped in usual sterile fashion  Exploration:     Hemostasis achieved with:  Direct pressure    Imaging outcome: foreign body not noted      Wound exploration: entire depth of wound visualized      Wound extent: no foreign bodies/material noted, no muscle damage noted, no nerve damage noted, no tendon damage noted, no underlying fracture noted and no vascular damage noted      Contaminated: yes    Treatment:     Area cleansed with:  Hibiclens and povidone-iodine    Amount of cleaning:  Standard    Irrigation solution:  Sterile saline    Irrigation volume:  20cc    Irrigation method:  Syringe    Debridement:  None    Layers repaired: Epidermis and SQ.  Skin repair:     Repair method:  Sutures    Suture size:  5-0    Suture material:  Prolene    Suture technique:  Simple interrupted    Number of sutures:  3  Approximation:     Approximation:  Close  Repair type:  Repair type:  Simple  Post-procedure details:     Dressing:  Non-adherent dressing    Procedure completion:  Tolerated well, no immediate complications  Comments:      Bleeding controlled      DIAGNOSTIC RESULTS     EKG: All EKG's are interpreted by the Emergency Department Physician who either signs or Co-signs this chart in the absence of a  cardiologist.    RADIOLOGY:   No orders to display       LABS:  Labs Reviewed - No data to display    All other labs were within normal range or not returned as of this dictation.    EMERGENCY DEPARTMENT COURSE/REASSESSMENT and MDM:          MDM  Number of Diagnoses or Management Options  Laceration of left thumb without foreign body without damage to nail, initial encounter  Diagnosis management comments: The patient's left thumb was sutured (see procedure note) and dressed neurovascularly intact. Bleeding controlled. INR 1.3 last week on Coumadin. Tetanus UTD. To return in 1 week for suture removal. Given precautions in which to return sooner.    Risk of Complications, Morbidity, and/or Mortality  Presenting problems: low  Diagnostic procedures: low  Management options: low    Patient Progress  Patient progress: improved        FINAL IMPRESSION      1. Laceration of left thumb without foreign body without damage to nail, initial encounter          DISPOSITION/PLAN   DISPOSITION Decision To Discharge 03/14/2022 09:43:18 PM      PATIENT REFERRED TO:  [facility] EMERGENCY DEPT  [address]  [place] [address]  [phone]  In 1 week  For suture removal      DISCHARGE MEDICATIONS:  There are no discharge medications for this patient.        (Please note that portions of this note were completed with a voice recognition program.  Efforts were made to edit the dictations but occasionally words are mis-transcribed.)    Valentina Shaggy, PA-C (electronically signed)  Emergency Medicine          Valentina Shaggy, PA-C  03/14/22 2208

## 2022-03-15 MED ORDER — LIDOCAINE HCL 1 % IJ SOLN
1 % | INTRAMUSCULAR | Status: AC
Start: 2022-03-15 — End: 2022-03-14
  Administered 2022-03-15: 02:00:00 5 via INTRADERMAL

## 2022-03-15 MED ORDER — LIDOCAINE HCL 1 % IJ SOLN
1 % | Freq: Once | INTRAMUSCULAR | Status: AC
Start: 2022-03-15 — End: 2022-03-14

## 2022-03-15 MED FILL — LIDOCAINE HCL 1 % IJ SOLN: 1 % | INTRAMUSCULAR | Qty: 10

## 2022-03-24 ENCOUNTER — Inpatient Hospital Stay: Admit: 2022-03-24 | Discharge: 2022-03-24

## 2022-03-24 DIAGNOSIS — I480 Paroxysmal atrial fibrillation: Secondary | ICD-10-CM

## 2022-03-24 LAB — PROTIME-INR
INR: 1.6 (ref 1.5–3.5)
Protime: 18.9 seconds — ABNORMAL HIGH (ref 11.6–14.5)

## 2022-04-05 ENCOUNTER — Inpatient Hospital Stay: Admit: 2022-04-05 | Discharge: 2022-04-05

## 2022-04-05 DIAGNOSIS — I480 Paroxysmal atrial fibrillation: Secondary | ICD-10-CM

## 2022-04-05 LAB — PROTIME-INR
INR: 2.2 (ref 1.5–3.5)
Protime: 24.8 seconds — ABNORMAL HIGH (ref 11.6–14.5)

## 2022-04-22 ENCOUNTER — Inpatient Hospital Stay: Admit: 2022-04-22 | Discharge: 2022-04-22

## 2022-04-22 DIAGNOSIS — I480 Paroxysmal atrial fibrillation: Secondary | ICD-10-CM

## 2022-04-22 LAB — PROTIME-INR
INR: 1.6 (ref 1.5–3.5)
Protime: 18.8 seconds — ABNORMAL HIGH (ref 11.6–14.5)

## 2022-05-03 ENCOUNTER — Inpatient Hospital Stay: Admit: 2022-05-03 | Discharge: 2022-05-03

## 2022-05-03 DIAGNOSIS — I480 Paroxysmal atrial fibrillation: Secondary | ICD-10-CM

## 2022-05-03 LAB — PROTIME-INR
INR: 1.8 (ref 1.5–3.5)
Protime: 20.7 seconds — ABNORMAL HIGH (ref 11.6–14.5)

## 2022-05-10 ENCOUNTER — Inpatient Hospital Stay: Admit: 2022-05-10 | Discharge: 2022-05-10

## 2022-05-10 DIAGNOSIS — I480 Paroxysmal atrial fibrillation: Secondary | ICD-10-CM

## 2022-05-10 LAB — PROTIME-INR
INR: 2.4 (ref 1.5–3.5)
Protime: 26.8 seconds — ABNORMAL HIGH (ref 11.6–14.5)

## 2022-06-01 ENCOUNTER — Inpatient Hospital Stay: Admit: 2022-06-01 | Discharge: 2022-06-01

## 2022-06-01 DIAGNOSIS — I480 Paroxysmal atrial fibrillation: Secondary | ICD-10-CM

## 2022-06-01 LAB — PROTIME-INR
INR: 2.4 (ref 1.5–3.5)
Protime: 26.3 seconds — ABNORMAL HIGH (ref 11.6–14.5)

## 2022-07-07 ENCOUNTER — Inpatient Hospital Stay: Admit: 2022-07-07 | Discharge: 2022-07-07

## 2022-07-07 DIAGNOSIS — I480 Paroxysmal atrial fibrillation: Secondary | ICD-10-CM

## 2022-07-07 LAB — PROTIME-INR
INR: 2.5 (ref 1.5–3.5)
Protime: 27.3 seconds — ABNORMAL HIGH (ref 11.6–14.5)

## 2022-08-13 ENCOUNTER — Encounter

## 2022-08-13 ENCOUNTER — Inpatient Hospital Stay: Admit: 2022-08-13 | Discharge: 2022-08-13

## 2022-08-13 DIAGNOSIS — I480 Paroxysmal atrial fibrillation: Secondary | ICD-10-CM

## 2022-08-13 LAB — PROTIME-INR
INR: 2.1 (ref 1.5–3.5)
Protime: 23.6 seconds — ABNORMAL HIGH (ref 11.6–14.5)

## 2022-09-07 ENCOUNTER — Inpatient Hospital Stay: Admit: 2022-09-07 | Discharge: 2022-09-07

## 2022-09-07 DIAGNOSIS — I10 Essential (primary) hypertension: Secondary | ICD-10-CM

## 2022-09-07 DIAGNOSIS — I480 Paroxysmal atrial fibrillation: Secondary | ICD-10-CM

## 2022-09-07 LAB — HEMOGLOBIN A1C
Est. Avg. Glucose, WB: 355
Est. Avg. Glucose-calculated: 421
Hemoglobin A1C: 14 % — ABNORMAL HIGH (ref 4.0–6.0)

## 2022-09-07 LAB — PROTIME-INR
INR: 2.5 (ref 1.5–3.5)
Protime: 26.2 seconds — ABNORMAL HIGH (ref 11.6–14.5)

## 2022-10-21 ENCOUNTER — Inpatient Hospital Stay: Admit: 2022-10-21 | Discharge: 2022-10-21

## 2022-10-21 DIAGNOSIS — I480 Paroxysmal atrial fibrillation: Secondary | ICD-10-CM

## 2022-10-21 LAB — PROTIME-INR
INR: 2.2 (ref 1.5–3.5)
Protime: 24.2 seconds — ABNORMAL HIGH (ref 11.6–14.5)

## 2022-11-24 ENCOUNTER — Encounter

## 2022-11-25 ENCOUNTER — Inpatient Hospital Stay: Admit: 2022-11-25 | Discharge: 2022-11-25

## 2022-11-25 DIAGNOSIS — I480 Paroxysmal atrial fibrillation: Secondary | ICD-10-CM

## 2022-11-25 LAB — HEMOGLOBIN A1C
Estimated Avg Glucose: 232
Estimated Avg Glucose: 268
Hemoglobin A1C: 9.7 % — ABNORMAL HIGH (ref 4.0–6.0)

## 2022-11-25 LAB — PROTIME-INR
INR: 2.4 (ref 1.5–3.5)
Protime: 26.3 seconds — ABNORMAL HIGH (ref 11.6–14.5)

## 2022-12-09 ENCOUNTER — Inpatient Hospital Stay
Admit: 2022-12-09 | Discharge: 2022-12-09 | Disposition: A | Discharge status: Home / Self Care | Attending: Emergency Medicine

## 2022-12-09 ENCOUNTER — Emergency Department: Admit: 2022-12-09

## 2022-12-09 DIAGNOSIS — S62347A Nondisplaced fracture of base of fifth metacarpal bone. left hand, initial encounter for closed fracture: Secondary | ICD-10-CM

## 2022-12-09 DIAGNOSIS — S60222A Contusion of left hand, initial encounter: Secondary | ICD-10-CM

## 2022-12-09 MED ORDER — TRAMADOL HCL 50 MG PO TABS
50 | ORAL_TABLET | Freq: Four times a day (QID) | ORAL | 0 refills | Status: AC | PRN
Start: 2022-12-09 — End: 2022-12-12

## 2022-12-09 MED ORDER — TRAMADOL HCL 50 MG PO TABS
50 | Freq: Once | ORAL | Status: AC
Start: 2022-12-09 — End: 2022-12-09
  Administered 2022-12-09: 11:00:00 50 mg via ORAL

## 2022-12-09 MED FILL — TRAMADOL HCL 50 MG PO TABS: 50 MG | ORAL | Qty: 1

## 2022-12-09 NOTE — ED Provider Notes (Signed)
[facility] EMERGENCY DEPT  EMERGENCY DEPARTMENT ENCOUNTER      Pt Name: Justin [name]  MRN: [id number]  Birthdate 1965/12/15  Date of evaluation: 12/09/2022  Provider evaluation time: 12/09/22 5284  Provider: Chana Bode, MD    CHIEF COMPLAINT       Chief Complaint   Patient presents with    Hand Injury     Pt ambulatory to ED with c/o left hand and arm pain. Pt reports he was skateboarding yesterday and fell, landing on hands and knees. Swelling and bruising noted to left hand. Abrasions noted to left knee and left foot. Denies hitting head, negative LOC. Hx: DM, afib (pt on warfarin)          HISTORY OF PRESENT ILLNESS    Last night while skateboarding patient fell and hit the ground.  He landed on his left hand.  Has a small abrasion on his left knee but it is not painful.  He continues to have pain in the right hand and the lateral aspect swollen.  He is on warfarin for A-fib but was wearing a helmet, says he did not hit his head and has no headache.    The history is provided by the patient.       Nursing Notes were reviewed.    REVIEW OF SYSTEMS       Review of Systems   Constitutional:  Negative for chills and fever.   Respiratory:  Negative for shortness of breath.    Cardiovascular:  Negative for chest pain.   Neurological:  Negative for headaches.   All other systems reviewed and are negative.      Except as noted above the remainder of the review of systems was reviewed and negative.       PAST MEDICAL HISTORY   No past medical history on file.    SURGICAL HISTORY     No past surgical history on file.    CURRENT MEDICATIONS       Previous Medications    No medications on file       ALLERGIES     Penicillins    FAMILY HISTORY     No family history on file.     SOCIAL HISTORY       Social History     Socioeconomic History    Marital status: Legally Separated       SCREENINGS         Glasgow Coma Scale  Eye Opening: Spontaneous  Best Verbal Response: Oriented  Best Motor Response: Obeys commands  Glasgow Coma  Scale Score: 15                     CIWA Assessment  BP: (!) 158/81  Pulse: 57                 PHYSICAL EXAM    (up to 7 for level 4, 8 or more for level 5)     ED Triage Vitals   BP Temp Temp Source Pulse Respirations SpO2 Height Weight - Scale   12/09/22 0631 12/09/22 0626 12/09/22 0626 12/09/22 0626 12/09/22 0626 12/09/22 0626 12/09/22 0626 12/09/22 0626   (!) 158/81 98.2 F (36.8 C) Oral 57 16 98 % 1.753 m (5\' 9" ) 79.4 kg (175 lb)       Physical Exam  Vitals and nursing note reviewed.   Constitutional:       Appearance: Normal appearance.   HENT:  Head: Normocephalic and atraumatic.   Cardiovascular:      Rate and Rhythm: Normal rate and regular rhythm.   Pulmonary:      Effort: Pulmonary effort is normal. No respiratory distress.   Musculoskeletal:        Hands:       Cervical back: Normal range of motion and neck supple. No tenderness.      Comments: Lateral aspect of the left hand is swollen and tender.  Pain with ranging the fourth and fifth digits.  2+ radial pulse.  No finger tenderness.    No midline back pain and other extremities are nontender with full range of motion except for abrasion on left knee   Skin:     General: Skin is warm and dry.   Neurological:      General: No focal deficit present.      Mental Status: He is alert.   Psychiatric:         Mood and Affect: Mood normal.         Behavior: Behavior normal.         DIAGNOSTIC RESULTS   PROCEDURES:  Unless otherwise noted below, none     Procedures    EKG: All EKG's are interpreted by the Emergency Department Physician who either signs or Co-signs this chart in the absence of a cardiologist.      RADIOLOGY:   Non-plain film images such as CT, Ultrasound and MRI are read by the radiologist. Plain radiographic images are visualized and preliminarily interpreted by the emergency physician with the below findings:    Interpretation per the Radiologist below, if available at the time of this note:    XR HAND LEFT (MIN 3 VIEWS)   Final Result    Linear lucency through the base of the fifth metacarpal, most likely    representing a nondisplaced fracture.          ED BEDSIDE ULTRASOUND:   Performed by ED Physician - none    LABS:  Labs Reviewed - No data to display    All other labs were within normal range or not returned as of this dictation.    EMERGENCY DEPARTMENT COURSE/REASSESSMENT and MDM:   Vitals:    Vitals:    12/09/22 0626 12/09/22 0631   BP:  (!) 158/81   Pulse: 57    Resp: 16    Temp: 98.2 F (36.8 C)    TempSrc: Oral    SpO2: 98%    Weight: 79.4 kg (175 lb)    Height: 1.753 m (5\' 9" )        ED Course:    ED Course as of 12/09/22 0722   Thu Dec 09, 2022   0720 X-ray read per radiology is possible fracture through the base of the fifth metatarsal that is nondisplaced [MH]      ED Course User Index  [MH] Chana Bode, MD       Medical Decision Making  X-ray is normal by my interpretation.  He is tender over the fourth and fifth metacarpal bones but I do not see any injury on the x-ray.  He is able to fully range his fingers that is just painful.  Intact sensation.  I suspect that the discomfort he is having is just from a contusion made worse by his warfarin use.    Based on my physical exam I was concerned for fracture.  I did not see any on the x-ray but called radiology.  They  believe there is a nondisplaced fracture in the base of the fifth metacarpal.    Patient was placed in an ulnar gutter splint by myself.  He will be referred to hand surgery for follow-up.    Amount and/or Complexity of Data Reviewed  Radiology: ordered.         CONSULTS:  None    FINAL IMPRESSION      1. Contusion of left hand, initial encounter    2. Closed nondisplaced fracture of base of fifth metacarpal bone of left hand, initial encounter          DISPOSITION/PLAN   DISPOSITION Decision To Discharge 12/09/2022 07:21:16 AM      PATIENT REFERRED TO:  Follow-up with your primary care physician.  If you do not have one call call [phone]    In 1  week      Carlyle Basques, MD  [address]  [address] [place] [address]  [phone]    In 1 week        DISCHARGE MEDICATIONS:  New Prescriptions    TRAMADOL (ULTRAM) 50 MG TABLET    Take 1 tablet by mouth every 6 hours as needed for Pain for up to 3 days. Intended supply: 3 days. Take lowest dose possible to manage pain Max Daily Amount: 200 mg     Controlled Substances Monitoring:          No data to display                (Please note that portions of this note were completed with a voice recognition program.  Efforts were made to edit the dictations but occasionally words are mis-transcribed.)    Chana Bode, MD (electronically signed)  Attending Emergency Physician           Chana Bode, MD  12/09/22 [phone]

## 2022-12-10 NOTE — Telephone Encounter (Signed)
Spoke w/patient and scheduled him for Monday.

## 2022-12-10 NOTE — Telephone Encounter (Addendum)
The patient went to [facility] ER on 6/20 for the left hand two fingers, xray in system, Dr.Young was the on call provider.  He was told to be seen by 6/27.  He can not be reached between the hours of 10-12 and 2-4 due to his work.

## 2022-12-13 ENCOUNTER — Ambulatory Visit: Admit: 2022-12-13 | Discharge: 2022-12-13 | Attending: Orthopaedic Surgery

## 2022-12-13 DIAGNOSIS — S62347A Nondisplaced fracture of base of fifth metacarpal bone. left hand, initial encounter for closed fracture: Secondary | ICD-10-CM

## 2022-12-13 NOTE — Progress Notes (Signed)
12/13/2022   Justin [name] is 57 y.o. male status post Closed left fifth metacarpal base fracture secondary to fall while skateboarding on 12/08/2022.  Patient was seen in the emergency room and was placed in a splint and is here for evaluation and treatment.  Patient complains of pain and notes clicking with flexion of his finger.    Review of systems and Past Medical History as documented above.  Occupation: Patient gives walking tours    Physical examination:   General appearance: Well-nourished, well-groomed  Mental status: Normal mood and affect  Oriented to time place and person  Normal gait pattern  Respiratory: Normal respiratory effort and movements, no audible wheezing  Cardiac: Regular rate and rhythm    Examination left hand after removal of the splint shows all digits are well-perfused with skin intact.  Sensibility intact.  Tenderness to palpation over base of fifth metacarpal.  Intact flexion extension but decreased range secondary to guarding.  No evidence of malrotation or angulation.  Intact FDP FDS all digits.    I independently reviewed his radiographs from 12/09/2022.  3 views of left hand demonstrate nondisplaced fifth metacarpal base fracture with no evidence of articular gap or step-off.  Images and findings reviewed with patient.    Impression: Closed nondisplaced left fifth metacarpal base fracture.  I reviewed with the patient the nature of this injury and have recommended nonoperative treatment.  I explained to him the means of immobilization and we provided him with a quick fit wrist brace but he wanted to more protection and thus was placed in a well-padded ulnar gutter plaster splint.  He can do range of motion but should not be doing any lifting or weightbearing left hand.  Follow-up in 2 weeks with repeat x-rays left hand out of plaster.        Please note that this note was generated using voice recognition Scientist, clinical (histocompatibility and immunogenetics).  Although every effort was made to ensure the  accuracy of this automated transcription, some errors in transcription may have occurred.

## 2022-12-20 NOTE — Telephone Encounter (Signed)
Patient asking if he can come back in to have hand re-wrapped. Please call. Thank you

## 2022-12-20 NOTE — Telephone Encounter (Signed)
Spoke w/patient and gave him the option to come in on Wednesday in [place] since we are going to be there for a few hours, however, he declined since he is going to be working. He will just wrap the ace in [place] until he can come in for his appt on Monday of next week.

## 2022-12-27 ENCOUNTER — Ambulatory Visit: Admit: 2022-12-27 | Discharge: 2022-12-27 | Attending: Orthopaedic Surgery

## 2022-12-27 ENCOUNTER — Ambulatory Visit: Admit: 2022-12-27 | Discharge: 2022-12-27

## 2022-12-27 VITALS — Ht 69.0 in | Wt 175.0 lb

## 2022-12-27 DIAGNOSIS — S62347A Nondisplaced fracture of base of fifth metacarpal bone. left hand, initial encounter for closed fracture: Secondary | ICD-10-CM

## 2022-12-27 NOTE — Progress Notes (Signed)
Justin [name] is 57 y.o. male status post closed left fifth metacarpal base fracture secondary to fall while skateboarding on 12/08/2022.  Patient states that he still has pain in the ulnar aspect of his hand does feel somewhat better.  He was in an ulnar gutter plaster splint.    Examination left hand shows tenderness palpation base of fifth metacarpal.  Intact flexion extension all digits without evidence of malrotation.  All digits well-perfused.  Sensibility intact light touch.    Indication: Left fifth metacarpal base fracture  Comparison: 12/09/2022  Technique: PA oblique lateral projections left hand  Findings: 3 views of left hand demonstrate nondisplaced fifth metacarpal base fracture with no change in position.  No evidence of articular gap or step-off.  Impression: Nondisplaced fracture base left fifth metacarpal    Images and findings reviewed with patient.    Impression: Patient is now almost 3 weeks status post his injury.  Patient will be provided with a removable wrist brace and can work on gentle range of motion but should avoid resistance activities.  Follow-up in 3 weeks with repeat x-rays left hand.

## 2023-01-05 ENCOUNTER — Inpatient Hospital Stay: Admit: 2023-01-05 | Discharge: 2023-01-05

## 2023-01-05 DIAGNOSIS — I480 Paroxysmal atrial fibrillation: Secondary | ICD-10-CM

## 2023-01-05 LAB — PROTIME-INR
INR: 2.9 (ref 1.5–3.5)
Protime: 30.6 seconds — ABNORMAL HIGH (ref 11.6–14.5)

## 2023-01-17 ENCOUNTER — Ambulatory Visit: Admit: 2023-01-17 | Discharge: 2023-01-17

## 2023-01-17 ENCOUNTER — Ambulatory Visit: Admit: 2023-01-17 | Discharge: 2023-01-17 | Attending: Orthopaedic Surgery

## 2023-01-17 DIAGNOSIS — S62347A Nondisplaced fracture of base of fifth metacarpal bone. left hand, initial encounter for closed fracture: Secondary | ICD-10-CM

## 2023-01-17 NOTE — Progress Notes (Signed)
Justin [name] is 57 y.o. male status post closed left fifth metacarpal base fracture secondary to fall while skateboarding on 12/08/2022.  Patient is much more comfortable and has continued to be using his removable wrist brace.    Examination left hand shows intact flexion extension all digits.  All digits are warm and well-perfused.  No evidence of angulation or malrotation.  Minimal tenderness palpation base of fifth metacarpal.    Indication: Left fifth metacarpal fracture  Comparison: 12/27/2022  Technique: PA oblique lateral projections left hand  Findings: 3 views of the left hand demonstrate interval healing of fifth metacarpal base fracture without significant displacement or malrotation.  Impression: Healing left fifth metacarpal base fracture    Images and findings reviewed with patient.    Impression: Healing left fifth metacarpal fracture.  I explained that full healing of this fracture is anticipated to be at 4 months.  As we approached 2 months from the injury he can begin weaning from the brace and follow-up in 4 weeks for repeat and probable final x-rays.

## 2023-02-14 ENCOUNTER — Encounter: Attending: Orthopaedic Surgery

## 2023-02-14 DIAGNOSIS — S62347A Nondisplaced fracture of base of fifth metacarpal bone. left hand, initial encounter for closed fracture: Secondary | ICD-10-CM

## 2023-03-07 NOTE — Progress Notes (Signed)
 Dental procedure codes completed this visit   . D0140 - LIMITED ORAL EVALUATION - PROBLEM FOCUSED (Completed)       Debridement  - Health history reviewed  - Chief dental complaint or concerns: Im here for for my cleaning. My teeth are extremely sensitive  - HBA1c last 11/25/22 9.7%  - Patient does not feel lightheaded but pulse never usually goes above 80 due to hypothyroid  - Assessment of plaque and calculus:  Severe anterior buildup mandibular anterior and posterior maxillary and mandibular teeth  - Local anesthetic used 2.5 Carpule of Lidocaine  85 mg , .042 mg epi- 1 carpule maxillary buccal infiltrations, 1 carpule IANB, .5 Carpule Long Buccal Infiltration  - Patient could not tolerate NSK after LA and we finished the appointment by hand scling as much as we could on the right.  - Oral hygiene assessment and instructions.  Recommended and demonstrated the use of these oral hygiene products:  Toothbrush, powered - [employer] Oral-B  - Dr. Graham was present and participated in treatment  - Next Periodontics appointment: debridement left side, resident Perio will help with Perio charting. Use hand instrumentation.    Lynwood Arms  Dental Student D4

## 2023-05-02 ENCOUNTER — Encounter

## 2023-05-02 ENCOUNTER — Inpatient Hospital Stay: Admit: 2023-05-02 | Discharge: 2023-05-02

## 2023-05-02 DIAGNOSIS — I480 Paroxysmal atrial fibrillation: Secondary | ICD-10-CM

## 2023-05-02 LAB — COMPREHENSIVE METABOLIC PANEL
ALT: 11 U/L (ref 0–42)
AST: <10 U/L (ref 0–46)
Albumin/Globulin Ratio: 1.4 (ref 1.00–2.70)
Albumin: 4.3 g/dL (ref 3.5–5.2)
Alk Phosphatase: 81 U/L (ref 40–130)
Anion Gap: 11 mmol/L (ref 2–17)
BUN: 22 mg/dL — ABNORMAL HIGH (ref 6–20)
CO2: 25 mmol/L (ref 22–29)
Calcium: 9.3 mg/dL (ref 8.5–10.7)
Chloride: 102 mmol/L (ref 98–107)
Creatinine: 1.2 mg/dL (ref 0.7–1.3)
Est, Glom Filt Rate: 71 mL/min/1.73mÂ² (ref >=60)
Globulin: 3.1 g/dL (ref 1.9–4.4)
Glucose: 275 mg/dL — ABNORMAL HIGH (ref 70–99)
Osmolaliy Calculated: 287 mosm/kg (ref 270–287)
Potassium: 4.7 mmol/L (ref 3.5–5.3)
Sodium: 137 mmol/L (ref 135–145)
Total Bilirubin: 0.32 mg/dL (ref 0.00–1.20)
Total Protein: 7.4 g/dL (ref 5.7–8.3)

## 2023-05-02 LAB — TSH WITH REFLEX: TSH: 1.69 u[IU]/mL (ref 0.358–3.740)

## 2023-05-02 LAB — PROTIME-INR
INR: 2.2 (ref 1.5–3.5)
Protime: 23.5 s — ABNORMAL HIGH (ref 11.6–14.5)

## 2023-05-02 LAB — HEMOGLOBIN A1C
Estimated Avg Glucose: 226
Estimated Avg Glucose: 261
Hemoglobin A1C: 9.5 % — ABNORMAL HIGH (ref 4.0–6.0)

## 2023-12-15 ENCOUNTER — Inpatient Hospital Stay
Admit: 2023-12-15 | Discharge: 2023-12-15 | Disposition: A | Discharge status: Home / Self Care | Arrived: WI | Attending: Emergency Medicine

## 2023-12-15 DIAGNOSIS — I480 Paroxysmal atrial fibrillation: Principal | ICD-10-CM

## 2023-12-15 LAB — PROTIME-INR
INR: 2.3 (ref 1.5–3.5)
Protime: 24.8 s — ABNORMAL HIGH (ref 11.6–14.5)

## 2023-12-15 LAB — BASIC METABOLIC PANEL
Anion Gap: 15 mmol/L (ref 2–17)
BUN: 26 mg/dL — ABNORMAL HIGH (ref 6–20)
CO2: 20 mmol/L — ABNORMAL LOW (ref 22–29)
Calcium: 9.4 mg/dL (ref 8.5–10.7)
Chloride: 100 mmol/L (ref 98–107)
Creatinine: 1.4 mg/dL — ABNORMAL HIGH (ref 0.7–1.3)
Est, Glom Filt Rate: 58 mL/min/1.73mÂ² — ABNORMAL LOW (ref >=60)
Glucose: 325 mg/dL — ABNORMAL HIGH (ref 70–99)
Osmolaliy Calculated: 288 mosm/kg — ABNORMAL HIGH (ref 270–287)
Potassium: 3.7 mmol/L (ref 3.5–5.3)
Sodium: 135 mmol/L (ref 135–145)

## 2023-12-15 LAB — PTT: APTT: 38 s — ABNORMAL HIGH (ref 23.3–34.5)

## 2023-12-15 LAB — CBC
Hematocrit: 45.1 % (ref 38.0–52.0)
Hemoglobin: 15.2 g/dL (ref 13.0–17.3)
MCH: 27.4 pg (ref 27.0–34.5)
MCHC: 33.7 g/dL (ref 30.0–36.0)
MCV: 81.4 fL — ABNORMAL LOW (ref 84.0–100.0)
MPV: 10.4 fL (ref 7.0–12.2)
NRBC Absolute: 0 10*3/uL (ref 0.000–0.012)
NRBC Automated: 0 % (ref 0.0–0.2)
Platelets: 331 10*3/uL (ref 140–440)
RBC: 5.54 x10e6/mcL (ref 4.00–5.60)
RDW: 13.4 % (ref 10.0–17.0)
WBC: 12 10*3/uL — ABNORMAL HIGH (ref 3.8–10.6)

## 2023-12-15 LAB — TROPONIN: Troponin, High Sensitivity: 9 ng/L (ref 0–22)

## 2023-12-15 LAB — MAGNESIUM: Magnesium: 2.4 mg/dL (ref 1.6–2.6)

## 2023-12-15 MED ORDER — METOPROLOL TARTRATE 5 MG/5ML IV SOLN
5 | INTRAVENOUS | Status: AC
Start: 2023-12-15 — End: 2023-12-15

## 2023-12-15 MED ORDER — METOPROLOL TARTRATE 5 MG/5ML IV SOLN
5 | INTRAVENOUS | Status: DC
Start: 2023-12-15 — End: 2023-12-15

## 2023-12-15 MED ORDER — AMIODARONE HCL IN DEXTROSE 150-4.21 MG/100ML-% IV SOLN
150-4.21 | INTRAVENOUS | Status: AC
Start: 2023-12-15 — End: 2023-12-15

## 2023-12-15 MED ORDER — METOPROLOL TARTRATE 5 MG/5ML IV SOLN
5 | Freq: Once | INTRAVENOUS | Status: DC
Start: 2023-12-15 — End: 2023-12-15

## 2023-12-15 MED FILL — METOPROLOL TARTRATE 5 MG/5ML IV SOLN: 5 mg/mL | INTRAVENOUS | Qty: 5 | Fill #0

## 2023-12-15 MED FILL — NEXTERONE 150-4.21 MG/100ML-% IV SOLN: 150-4.21 MG/100ML-% | INTRAVENOUS | Qty: 100 | Fill #0

## 2023-12-15 NOTE — Discharge Instructions (Signed)
 Take your medicine as prescribed.  Follow-up with Dr. Dovie and give Dr. Harvey a call.

## 2023-12-15 NOTE — ED Provider Notes (Signed)
 [facility] EMERGENCY DEPT  EMERGENCY DEPARTMENT ENCOUNTER      Pt Name: Justin [name]  MRN: [id number]  Birthdate June 13, 1966  Date of evaluation: 12/15/2023  Provider: Norleen Tonette Bucco, MD    CHIEF COMPLAINT       Chief Complaint   Patient presents with    Tachycardia     Pt complains of sudden a fib, on coumadin.          HISTORY OF PRESENT ILLNESS    HPI  58 y.o. male presents today with with a history of A-fib comes in complaining of A-fib.  He is anticoagulated on Coumadin.  He is had multiple episodes of A-fib in the past and this episode started about 30 minutes prior to arrival.  No chest pain but short of breath.  Original done on downtime      Nursing Notes were reviewed.    REVIEW OF SYSTEMS     Review of Systems   Constitutional:  Negative for chills, diaphoresis and fatigue.   Respiratory:  Positive for shortness of breath.    Cardiovascular:  Positive for palpitations. Negative for chest pain.   Gastrointestinal:  Negative for abdominal pain.   Neurological:  Negative for light-headedness.        PAST MEDICAL HISTORY   No past medical history on file.    SURGICAL HISTORY     No past surgical history on file.    CURRENT MEDICATIONS       Previous Medications    AMIODARONE  (CORDARONE ) 200 MG TABLET    Take 0.5 tablets by mouth    ASPIRIN  81 MG EC TABLET    Take 1 tablet by mouth    FAMOTIDINE (PEPCID) 20 MG TABLET    Take 1 tablet by mouth    JANTOVEN 5 MG TABLET    TAKE ONE AND ONE-HALF TO TWO TABLETS BY MOUTH ONCE DAILY AS DIRECTED    LOSARTAN-HYDROCHLOROTHIAZIDE (HYZAAR) 100-12.5 MG PER TABLET    Take 1 tablet by mouth daily    METFORMIN (GLUCOPHAGE) 500 MG TABLET    Take 1 tablet by mouth 2 times daily (with meals)    METOPROLOL  SUCCINATE (TOPROL  XL) 50 MG EXTENDED RELEASE TABLET    Take 1 tablet by mouth    PRAVACHOL 40 MG TABLET    Take 1 tablet by mouth       ALLERGIES     Penicillins    FAMILY HISTORY     No family history on file.     SOCIAL HISTORY       Social History     Socioeconomic History     Marital status: Legally Separated   Tobacco Use    Smoking status: Former     Average packs/day: 1 pack/day for 29.0 years (29.0 ttl pk-yrs)     Types: Cigarettes     Start date: 1984     Passive exposure: Past    Smokeless tobacco: Never   Substance and Sexual Activity    Alcohol use: Not Currently    Drug use: Never       SCREENINGS         Glasgow Coma Scale  Eye Opening: Spontaneous  Best Verbal Response: Oriented  Best Motor Response: Obeys commands  Glasgow Coma Scale Score: 15                     CIWA Assessment  BP: (!) 136/95  Pulse: 57  PHYSICAL EXAM    (up to 7 for level 4, 8 or more for level 5)     ED Triage Vitals   BP Systolic BP Percentile Diastolic BP Percentile Temp Temp src Pulse Resp SpO2   -- -- -- -- -- -- -- --      Height Weight         -- --             Physical Exam  Vitals and nursing note reviewed.   Constitutional:       General: He is not in acute distress.     Appearance: Normal appearance. He is not ill-appearing.   HENT:      Head: Normocephalic and atraumatic.      Right Ear: External ear normal.      Left Ear: External ear normal.      Nose: Nose normal.      Mouth/Throat:      Mouth: Mucous membranes are moist.      Pharynx: No oropharyngeal exudate or posterior oropharyngeal erythema.   Eyes:      Extraocular Movements: Extraocular movements intact.      Conjunctiva/sclera: Conjunctivae normal.   Neck:      Vascular: No carotid bruit.   Cardiovascular:      Rate and Rhythm: Tachycardia present. Rhythm irregular.      Pulses: Normal pulses.           Dorsalis pedis pulses are 2+ on the right side and 2+ on the left side.   Pulmonary:      Effort: Pulmonary effort is normal. No respiratory distress.      Breath sounds: Normal breath sounds.   Abdominal:      General: Abdomen is flat.      Palpations: Abdomen is soft. There is no mass.      Tenderness: There is no abdominal tenderness. There is no right CVA tenderness, left CVA tenderness or guarding.    Musculoskeletal:         General: No swelling, tenderness or deformity. Normal range of motion.      Cervical back: Normal range of motion. No rigidity or tenderness.      Right lower leg: No edema.      Left lower leg: No edema.   Skin:     Capillary Refill: Capillary refill takes 2 to 3 seconds.      Findings: No erythema or rash.   Neurological:      General: No focal deficit present.      Mental Status: He is alert and oriented to person, place, and time.      Gait: Gait normal.   Psychiatric:         Behavior: Behavior normal.         DIAGNOSTIC RESULTS   PROCEDURES:  Unless otherwise noted below, none     Procedures    EKG: All EKG's are interpreted by the Emergency Department Physician who either signs or Co-signs this chart in the absence of a cardiologist.      RADIOLOGY:   Non-plain film images such as CT, Ultrasound and MRI are read by the radiologist. Plain radiographic images are visualized and preliminarily interpreted by the emergency physician with the below findings:    Interpretation per the Radiologist below, if available at the time of this note:    No orders to display       ED BEDSIDE ULTRASOUND:   Performed by ED Physician - none  LABS:  Labs Reviewed - No data to display    All other labs were within normal range or not returned as of this dictation.    EMERGENCY DEPARTMENT COURSE/REASSESSMENT and MDM:   Vitals:    Vitals:    12/15/23 1000 12/15/23 1115   BP: (!) 160/90 (!) 136/95   Pulse: (!) 140 57   Resp: 16 10   Temp: 97.7 F (36.5 C)    SpO2: 100% 98%   Weight: 80 kg (176 lb 5.9 oz)    Height: 1.753 m (5' 9)        ED Course:    ED Course as of 12/15/23 1134   Thu Dec 15, 2023   1128 Patient converted to sinus rhythm with a amiodarone  and the Lopressor .  He did require the second dose of Lopressor .  He is completely stable and comfortable.  His lab data is pending as it was downtime when he checked in.  But he is going to check it on his MyChart.  Does not want to stay [JW]   1131  Patient presented with A-fib and a rapid ventricular response.  He was given amiodarone  and Lopressor  and after relatively short time converted.  He is in sinus rhythm now.  Rate is normal.  Pressure is good.  He feels fine.  Blood test are still pending in the lab because of downtime but he wants to go home and will check in with his MyChart [JW]      ED Course User Index  [JW] Smitty Norleen Malloy, MD       Medical Decision Making  Amount and/or Complexity of Data Reviewed  ECG/medicine tests: ordered.    Risk  Prescription drug management.       CONSULTS:  None    FINAL IMPRESSION      1. Paroxysmal atrial fibrillation [facility])          DISPOSITION/PLAN   DISPOSITION Decision To Discharge 12/15/2023 11:31:28 AM   DISPOSITION CONDITION Stable           PATIENT REFERRED TO:  Dovie Lamar Blunt, MD  [address]  [place] [address]  [phone]    Schedule an appointment as soon as possible for a visit       Delmar Ozell BRAVO, MD  [address] [place] [address]  [phone]    Schedule an appointment as soon as possible for a visit         DISCHARGE MEDICATIONS:  New Prescriptions    No medications on file     Controlled Substances Monitoring:          No data to display                (Please note that portions of this note were completed with a voice recognition program.  Efforts were made to edit the dictations but occasionally words are mis-transcribed.)    Norleen Malloy Smitty, MD (electronically signed)  Attending Emergency Physician          Smitty Norleen Malloy, MD  12/15/23 1134

## 2023-12-16 LAB — EKG 12-LEAD
P Axis: 51 degrees
P-R Interval: 0 ms
P-R Interval: 180 ms
Q-T Interval: 374 ms
Q-T Interval: 426 ms
QRS Duration: 150 ms
QRS Duration: 146 ms
QTc Calculation (Bazett): 452 ms
QTc Calculation (Bazett): 421 ms
R Axis: 99 degrees
R Axis: 81 degrees
T Axis: 30 degrees
T Axis: 47 degrees
Ventricular Rate: 133 {beats}/min
Ventricular Rate: 57 {beats}/min

## 2024-01-27 ENCOUNTER — Ambulatory Visit
Admit: 2024-01-27 | Discharge: 2024-01-27 | Payer: BLUE CROSS/BLUE SHIELD | Attending: Cardiovascular Disease | Primary: Student in an Organized Health Care Education/Training Program

## 2024-01-27 DIAGNOSIS — I480 Paroxysmal atrial fibrillation: Secondary | ICD-10-CM

## 2024-01-27 DIAGNOSIS — I4891 Unspecified atrial fibrillation: Principal | ICD-10-CM

## 2024-01-27 NOTE — Progress Notes (Signed)
 [facility] Arrhythmia / Cardiac Electrophysiology    Date of Visit:  01/27/2024      Patient name: Justin [name]  Date of Birth: [birth year]    History of Present Illness  The patient presents for a new consultation for paroxysmal atrial fibrillation.  He had an ER visit recently at [facility] 12/15/2023.  He experiences arrhythmia 3 to 4 times a week, describing it as a sensation of his heart being squeezed. He has never lost consciousness due to his condition. He identifies alcohol and stress as triggers for his symptoms and has been under significant financial stress recently. He avoids alcohol and vitamin K in his diet. He maintains a low-salt diet, drinks plenty of water, and limits his caffeine intake. He is considering ablation therapy to avoid frequent hospital visits.     He was seen in the emergency room in [place] on Nov 16, 2023 was markedly hypertensive and in atrial fibrillation with RVR he was given a bolus of amiodarone  converted back to sinus rhythm.  Systolic blood pressure at the time was 212/87.  The emergency room doctor recommended he stay but he had the flight the next morning and was discharged with plans for follow-up locally in [place] .      Outside ECG from [facility] [place]  Nov 16, 2023 shows atrial fibrillation with RVR to 148 bpm.  This might even be atrial flutter although it is hard to see P waves.  According to the documentation it looks like he left emergency room AMA.    Avoids alcohol    He recalls an episode of atrial fibrillation in [place] after walking from his hotel to downtown, which was unusual as he typically does not tire easily from walking. He also mentions that his wife, who has agoraphobia, is not always able to accompany him to the ER, leading him to drive himself there on occasion.    He is currently on metoprolol , amiodarone , and warfarin. He notes that if he misses two days of amiodarone , his heart rate increases to around 67 beats per  minute. If he misses two days of metoprolol , he goes into atrial fibrillation. He was previously on Eliquis but switched to warfarin due to insurance issues.     He continues to take warfarin, which costs him $5 per month, and undergoes monthly INR checks via phlebotomy.     He recently acquired insurance on 01/20/2024.    He is also prescribed blood pressure medication, which he has not been taking.    SOCIAL HISTORY  Marital Status: Married  Education Level: High school graduate  Occupations: Walking tour guide in downtown [place]  Exercise: Walking daily for work  Diet: Avoids foods high in vitamin K, drinks a lot of water, avoids salt  Alcohol: Reports no alcohol intake  Coffee/Tea/Caffeine-containing Drinks: Reports minimal caffeine intake  History of hypertension and diabetes.  Family history of hypertension.      I reviewed some outside records from [facility] in [place]  including a creatinine of 1.9 on Nov 16, 2023.  Potassium 4.0.  AST and ALT were normal.  Other PMH/problem list, FH, SH, Allergies reviewed and updated in EPIC.  Outside records requested and reviewed.  ROS:  All other systems reviewed and are negative.    Outpatient Medications Marked as Taking for the 01/27/24 encounter (Office Visit) with Aviraj Kentner E, MD   Medication Sig Dispense Refill    metoprolol  succinate (TOPROL  XL) 50 MG extended release  tablet Take 1 tablet by mouth      amiodarone  (CORDARONE ) 200 MG tablet Take 0.5 tablets by mouth      PRAVACHOL 40 MG tablet Take 1 tablet by mouth      JANTOVEN 5 MG tablet TAKE ONE AND ONE-HALF TO TWO TABLETS BY MOUTH ONCE DAILY AS DIRECTED      metFORMIN (GLUCOPHAGE) 500 MG tablet Take 1 tablet by mouth 2 times daily (with meals)      losartan-hydroCHLOROthiazide (HYZAAR) 100-12.5 MG per tablet Take 1 tablet by mouth daily      aspirin  81 MG EC tablet Take 1 tablet by mouth      famotidine (PEPCID) 20 MG tablet Take 1 tablet by mouth         EXAM:  Vitals:  BP 138/78 (BP Site: Right Upper Arm, Patient Position: Sitting, BP Cuff Size: Medium Adult)   Pulse 61   Resp 17   Ht 1.753 m (5' 9.02)   Wt 80.7 kg (178 lb)   SpO2 97%   BMI 26.27 kg/m   Gen: No acute distress.    Skin exam shows no obvious rash.  HEENT: Normocephalic/atraumatic.   Oral: mucous membranes moist  Neck: JVP is not elevated  Resp: Lungs are clear to auscultation bilaterally.   Cardiac: Regular rate and rhythm. No murmur, rub or gallop.  S1 and S2 are normal. Pulses are 2+ bilaterally. No carotid bruits are auscultated.  GI: Abdomen is soft and non-tender. There is no distention.   Extremities: No peripheral edema.  MSK: Normal strength throughout.     Neuro: alert and oriented to person, place and time  Psych: mood/affect appropriate    ECG in clinic personally reviewed     LABS AND IMAGING: reviewed     Lab Results   Component Value Date    WBC 12.0 (H) 12/15/2023    HGB 15.2 12/15/2023    HCT 45.1 12/15/2023    MCV 81.4 (L) 12/15/2023    PLT 331 12/15/2023     Lab Results   Component Value Date    NA 135 12/15/2023    K 3.7 12/15/2023    CL 100 12/15/2023    CO2 20 (L) 12/15/2023    BUN 26 (H) 12/15/2023    CREATININE 1.4 (H) 12/15/2023    GLUCOSE 325 (H) 12/15/2023       I reviewed an ECG from November 23, 2023 showing sinus rhythm with a right bundle branch block.  Sinus rate is 50 bpm.    History of coronary angiogram March 2022.    IMPRESSION:   Paroxysmal AF  Mutiple ER visits  Failed amiodarone      I discussed with the patient the pathophysiology of atrial fibrillation, including details about potential triggers.  We also discussed the therapeutic options for treatment of atrial fibrillation, including:  --Risk factor modification   --Rate control   --Rhythm control with antiarrhythmic drugs (AAD) and supplemental rate control  --Catheter ablation, including pulmonary vein isolation (PVI), with or without additional substrate modification, in attempt to maintain sinus rhythm long-term    The  benefits and risks of each of these approaches were outlined in detail. We discussed the variable success rates of AF ablation, which can range from 50-80+%, depending on multiple factors, including paroxysmal vs. persistent AF, duration of AF, AF burden, left atrial size and remodeling, concomitant valvular or other structural heart disease, and other potential confounding factors. I also explained that often (approximately 20-30% of the time)  patients will require multiple procedures to achieve long-term AF suppression. Additionally, we discussed that ablation may decrease AF burden and/or symptoms, but additional therapeutic strategies (i.e. concomitant AAD therapy, rate controlling medications, oral anticoagulants, etc.) may be needed long term for AF management.    The catheter ablation procedure was described to the patient in detail. Complications occur in up to 5% of patients with most being vascular.   The most common vascular / groin complications include bruising and bleeding at the vascular access sites.  Less common vascular complications include arterial or venous fistula or pseudoaneurysm.  Other risks include recurrent AF/AT, need for repeat ablation, esophageal perforation/fistula, pulmonary vein stenosis, bronchial injury/fistula, stroke, cardiac perforation / tamponade with the need for catheter drainage or surgical repair, kidney failure, need for permanent pacemaker, phrenic or vagus nerve damage resulting in diaphragmatic paralysis or gastroparesis, and even death.  Many of these are less common now with PFA.    The patient expressed a good understanding of the options and risks.  Patient had time for all questions to be answered.    Wishes to avoid the long term risks of amidorone.      PLAN:  Plan for AF ablation    Stop the metoprolol  3 days prior to the ablation  Stop the amiodarone  today  He's on warfarin - Goal INR 1.8 - 2.5 Clabe Lever PA follows - he takes 1.5 tablets 3 days and then on  every 4th day takes 2 pills of the 5 mg)  Take the losartan / HCTZ the morning of the procedure to prevent hypertension on arrival    Darrion Wyszynski E. Donasia Wimes, MD  Cardiac Electrophysiology  [facility] Arrhythmia

## 2024-02-02 NOTE — Telephone Encounter (Addendum)
 Spoke with on phone regarding pre procedure instructions for 8/20 ablation with Dr Delmar, arrival time of 1300 PM. Verbalized understanding of arrival time, where to go, and parking. Aware of holding all meds the AM of procedure.      02/07/24 0830 AM--notified patient that his INR was 2, and per Dr Delmar, he can take his Jantoven tonight. He will take 1.5 tabs, or 7.5 mg. Also will take losartan in the AM with a sip of water, as ordered. Understands NPO after midnight, on 8/20.

## 2024-02-06 ENCOUNTER — Inpatient Hospital Stay
Admit: 2024-02-06 | Discharge: 2024-02-06 | Payer: BLUE CROSS/BLUE SHIELD | Primary: Student in an Organized Health Care Education/Training Program

## 2024-02-06 DIAGNOSIS — I480 Paroxysmal atrial fibrillation: Principal | ICD-10-CM

## 2024-02-06 LAB — BASIC METABOLIC PANEL
Anion Gap: 13 mmol/L (ref 2–17)
BUN: 30 mg/dL — ABNORMAL HIGH (ref 6–20)
CO2: 21 mmol/L — ABNORMAL LOW (ref 22–29)
Calcium: 9.7 mg/dL (ref 8.5–10.7)
Chloride: 103 mmol/L (ref 98–107)
Creatinine: 1.5 mg/dL — ABNORMAL HIGH (ref 0.7–1.3)
Est, Glom Filt Rate: 54 mL/min/1.73m — ABNORMAL LOW (ref >=60)
Glucose: 321 mg/dL — ABNORMAL HIGH (ref 70–99)
Osmolaliy Calculated: 292 mosm/kg — ABNORMAL HIGH (ref 270–287)
Potassium: 4.9 mmol/L (ref 3.5–5.3)
Sodium: 137 mmol/L (ref 135–145)

## 2024-02-06 LAB — CBC
Hematocrit: 45.6 % (ref 38.0–52.0)
Hemoglobin: 14.2 g/dL (ref 13.0–17.3)
MCH: 26.6 pg — ABNORMAL LOW (ref 27.0–34.5)
MCHC: 31.1 g/dL (ref 30.0–36.0)
MCV: 85.4 fL (ref 84.0–100.0)
MPV: 9.7 fL (ref 7.0–12.2)
NRBC Absolute: 0 x10e3/mcL (ref 0.000–0.012)
NRBC Automated: 0 % (ref 0.0–0.2)
Platelets: 311 x10e3/mcL (ref 140–440)
RBC: 5.34 x10e6/mcL (ref 4.00–5.60)
RDW: 13.9 % (ref 10.0–17.0)
WBC: 9.6 x10e3/mcL (ref 3.8–10.6)

## 2024-02-06 LAB — PROTIME-INR
INR: 2 (ref 1.5–3.5)
Protime: 22.5 s — ABNORMAL HIGH (ref 11.6–14.5)

## 2024-02-06 NOTE — Telephone Encounter (Signed)
 Mico Drummer:    You are scheduled for an ABLATION on [facility] February 08, 2024, at [facility] [address] [place], [place] [address]. Please arrive at the outpatient department at 1:00 PM (1st Floors Main Entrance).    YOU ARE TO HOLD METOPROLOL  3 DAYS PRIOR TO PROCEDURE. YOUR LAST DOSE WILL BE ON 02/04/2024.    TAKE MEDICATIONS THE DAY PRIOR TO THE PROCEDURE EXCEPT METOPROLOL  02/07/2024.    YOU ARE TO TAKE LOSARTAN THE MORNING OF THE PROCEDURE WITH A SIP OF WATER 02/08/2024.    HOLD ALL MEDICATIONS THE MORNING OF THE PROCEDURE EXCEPT LOSARTAN 02/08/2024.    DO NOT EAT/DRINK THE MORNING OF THE PROCEDURE 02/08/2024.  PLEASE HAVE LABS COMPLETED BY 02/03/2024 AT A LOCAL [facility]. FASTING IS NOT REQUIRED.    IF YOU USE ANY SLEEP APNEA DEVICES, SUCH AS A CPAP MACHINE. PLEASE BRING IT WITH YOU TO THE HOSPITAL.    Please take only a current copy of your MEDICATION LIST to the hospital on the day of your procedure. DO NOT TAKE MEDICATION BOTTLES!    PLEASE BE SURE TO BRING SOMEONE WITH YOU THAT IS CAPABLE OF DRIVING YOU HOME AFTER YOU ARE DISCHARGED.    YOUR FOLLOW UP APPOINTMENT IS SCHEDULED ON 03/23/2024 @ 10:45 AM [address] [place] [address]    If you have any questions regarding your arrival/procedure time, please call [phone] OPTION #3. Select the option to speak with the Procedure Scheduler of Dr. Delmar Carmen)    IF THERE ARE ANY CHANGES MADE TO YOUR MEDICATION [place], PLEASE BE SURE TO CALL THE OFFICE IMMEDIATELY.    Thank you,  Procedure Scheduler for Dr. Delmar

## 2024-02-08 ENCOUNTER — Observation Stay
Admit: 2024-02-08 | Discharge: 2024-02-09 | Disposition: A | Discharge status: Home / Self Care | Payer: BLUE CROSS/BLUE SHIELD | Attending: Cardiovascular Disease | Admitting: Cardiovascular Disease

## 2024-02-08 ENCOUNTER — Ambulatory Visit
Admit: 2024-02-08 | Payer: BLUE CROSS/BLUE SHIELD | Primary: Student in an Organized Health Care Education/Training Program

## 2024-02-08 DIAGNOSIS — I480 Paroxysmal atrial fibrillation: Principal | ICD-10-CM

## 2024-02-08 LAB — POCT ACT-K
Coagulation time, activated, POC: 337 s — ABNORMAL HIGH (ref 100–141)
Coagulation time, activated, POC: 389 s — ABNORMAL HIGH (ref 100–141)

## 2024-02-08 LAB — EKG 12-LEAD
Atrial Rate: 81 {beats}/min
Diagnosis: NORMAL
P Axis: 59 degrees
P-R Interval: 148 ms
Q-T Interval: 410 ms
QRS Duration: 142 ms
QTc Calculation (Bazett): 476 ms
R Axis: 70 degrees
T Axis: 34 degrees
Ventricular Rate: 81 {beats}/min

## 2024-02-08 LAB — PROTIME-INR
INR: 2 (ref 1.5–3.5)
Protime: 22.4 s — ABNORMAL HIGH (ref 11.6–14.5)

## 2024-02-08 LAB — POCT GLUCOSE: POC Glucose: 214 mg/dL — ABNORMAL HIGH (ref 65.0–110.0)

## 2024-02-08 MED ORDER — METOPROLOL SUCCINATE ER 50 MG PO TB24
50 | Freq: Every day | ORAL | Status: DC
Start: 2024-02-08 — End: 2024-02-09
  Administered 2024-02-09: 13:00:00 50 mg via ORAL

## 2024-02-08 MED ORDER — GLUCOSE 4 G PO CHEW
4 | ORAL | Status: DC | PRN
Start: 2024-02-08 — End: 2024-02-09

## 2024-02-08 MED ORDER — METFORMIN HCL 500 MG PO TABS
500 | Freq: Two times a day (BID) | ORAL | Status: DC
Start: 2024-02-08 — End: 2024-02-09
  Administered 2024-02-09 (×2): 500 mg via ORAL

## 2024-02-08 MED ORDER — ACETAMINOPHEN 325 MG PO TABS
325 | Freq: Four times a day (QID) | ORAL | Status: DC | PRN
Start: 2024-02-08 — End: 2024-02-09

## 2024-02-08 MED ORDER — ACETAMINOPHEN 650 MG RE SUPP
650 | Freq: Four times a day (QID) | RECTAL | Status: DC | PRN
Start: 2024-02-08 — End: 2024-02-09

## 2024-02-08 MED ORDER — SODIUM CHLORIDE 0.9 % IV SOLN
0.9 | INTRAVENOUS | Status: DC
Start: 2024-02-08 — End: 2024-02-08
  Administered 2024-02-08: 17:00:00 via INTRAVENOUS

## 2024-02-08 MED ORDER — POTASSIUM CHLORIDE 10 MEQ/100ML IV SOLN
10 | INTRAVENOUS | Status: DC | PRN
Start: 2024-02-08 — End: 2024-02-09

## 2024-02-08 MED ORDER — FENTANYL CITRATE (PF) 100 MCG/2ML IJ SOLN
100 | INTRAMUSCULAR | Status: DC | PRN
Start: 2024-02-08 — End: 2024-02-08

## 2024-02-08 MED ORDER — PROTAMINE SULFATE 10 MG/ML IV SOLN
10 | INTRAVENOUS | Status: AC
Start: 2024-02-08 — End: 2024-02-08

## 2024-02-08 MED ORDER — POTASSIUM CHLORIDE CRYS ER 20 MEQ PO TBCR
20 | ORAL | Status: DC | PRN
Start: 2024-02-08 — End: 2024-02-09

## 2024-02-08 MED ORDER — MELATONIN 3 MG PO TABS
3 | Freq: Every evening | ORAL | Status: DC | PRN
Start: 2024-02-08 — End: 2024-02-09
  Administered 2024-02-09: 02:00:00 3 mg via ORAL

## 2024-02-08 MED ORDER — PRAVASTATIN SODIUM 20 MG PO TABS
20 | Freq: Every day | ORAL | Status: DC
Start: 2024-02-08 — End: 2024-02-09
  Administered 2024-02-09 (×2): 40 mg via ORAL

## 2024-02-08 MED ORDER — LIDOCAINE HCL 2 % IJ SOLN
2 | Freq: Once | INTRAMUSCULAR | Status: DC | PRN
Start: 2024-02-08 — End: 2024-02-08
  Administered 2024-02-08: 20:00:00 40 via INTRAVENOUS

## 2024-02-08 MED ORDER — PROPOFOL 200 MG/20ML IV EMUL
200 | Freq: Once | INTRAVENOUS | Status: DC | PRN
Start: 2024-02-08 — End: 2024-02-08
  Administered 2024-02-08: 20:00:00 150 via INTRAVENOUS
  Administered 2024-02-08: 21:00:00 50 via INTRAVENOUS

## 2024-02-08 MED ORDER — DEXTROSE 10 % IV BOLUS
INTRAVENOUS | Status: DC | PRN
Start: 2024-02-08 — End: 2024-02-09

## 2024-02-08 MED ORDER — NORMAL SALINE FLUSH 0.9 % IV SOLN
0.9 | INTRAVENOUS | Status: DC | PRN
Start: 2024-02-08 — End: 2024-02-09

## 2024-02-08 MED ORDER — NALOXONE HCL 0.4 MG/ML IJ SOLN
0.4 | INTRAMUSCULAR | Status: DC | PRN
Start: 2024-02-08 — End: 2024-02-08

## 2024-02-08 MED ORDER — FENTANYL CITRATE (PF) 100 MCG/2ML IJ SOLN
100 | Freq: Once | INTRAMUSCULAR | Status: DC | PRN
Start: 2024-02-08 — End: 2024-02-08
  Administered 2024-02-08: 20:00:00 100 via INTRAVENOUS

## 2024-02-08 MED ORDER — PROTAMINE SULFATE 10 MG/ML IV SOLN
10 | Freq: Once | INTRAVENOUS | Status: DC | PRN
Start: 2024-02-08 — End: 2024-02-08
  Administered 2024-02-08: 21:00:00 50 via INTRAVENOUS

## 2024-02-08 MED ORDER — POTASSIUM BICARB-CITRIC ACID 20 MEQ PO TBEF
20 | ORAL | Status: DC | PRN
Start: 2024-02-08 — End: 2024-02-09

## 2024-02-08 MED ORDER — DEXAMETHASONE SODIUM PHOSPHATE 4 MG/ML IJ SOLN
4 | Freq: Once | INTRAMUSCULAR | Status: DC | PRN
Start: 2024-02-08 — End: 2024-02-08
  Administered 2024-02-08 (×2): 4 via INTRAVENOUS

## 2024-02-08 MED ORDER — NORMAL SALINE FLUSH 0.9 % IV SOLN
0.9 | Freq: Two times a day (BID) | INTRAVENOUS | Status: DC
Start: 2024-02-08 — End: 2024-02-09

## 2024-02-08 MED ORDER — DEXTROSE 10 % IV SOLN
10 | INTRAVENOUS | Status: DC | PRN
Start: 2024-02-08 — End: 2024-02-09

## 2024-02-08 MED ORDER — INSULIN LISPRO 100 UNIT/ML IJ SOLN
100 | Freq: Four times a day (QID) | INTRAMUSCULAR | Status: DC
Start: 2024-02-08 — End: 2024-02-09
  Administered 2024-02-09 (×2): 6 [IU] via SUBCUTANEOUS
  Administered 2024-02-09: 18:00:00 2 [IU] via SUBCUTANEOUS

## 2024-02-08 MED ORDER — NORMAL SALINE FLUSH 0.9 % IV SOLN
0.9 | INTRAVENOUS | Status: DC | PRN
Start: 2024-02-08 — End: 2024-02-08

## 2024-02-08 MED ORDER — HEPARIN SODIUM (PORCINE) 1000 UNIT/ML IJ SOLN
1000 | Freq: Once | INTRAMUSCULAR | Status: DC | PRN
Start: 2024-02-08 — End: 2024-02-08
  Administered 2024-02-08: 21:00:00 3000 via INTRAVENOUS
  Administered 2024-02-08: 20:00:00 12000 via INTRAVENOUS

## 2024-02-08 MED ORDER — SODIUM CHLORIDE 0.9 % IV SOLN
0.9 | INTRAVENOUS | Status: DC | PRN
Start: 2024-02-08 — End: 2024-02-09

## 2024-02-08 MED ORDER — GLUCAGON EMERGENCY 1 MG/ML IJ SOLR
1 | INTRAMUSCULAR | Status: DC | PRN
Start: 2024-02-08 — End: 2024-02-09

## 2024-02-08 MED ORDER — NORMAL SALINE FLUSH 0.9 % IV SOLN
0.9 | Freq: Two times a day (BID) | INTRAVENOUS | Status: DC
Start: 2024-02-08 — End: 2024-02-09
  Administered 2024-02-09: 02:00:00 10 mL via INTRAVENOUS

## 2024-02-08 MED ORDER — NORMAL SALINE FLUSH 0.9 % IV SOLN
0.9 | Freq: Two times a day (BID) | INTRAVENOUS | Status: DC
Start: 2024-02-08 — End: 2024-02-08

## 2024-02-08 MED ORDER — SODIUM CHLORIDE 0.9 % IV SOLN
0.9 | INTRAVENOUS | Status: DC | PRN
Start: 2024-02-08 — End: 2024-02-08

## 2024-02-08 MED ORDER — GLYCOPYRROLATE 0.2 MG/ML IJ SOLN
0.2 | Freq: Once | INTRAMUSCULAR | Status: DC | PRN
Start: 2024-02-08 — End: 2024-02-08
  Administered 2024-02-08: 20:00:00 .2 via INTRAVENOUS

## 2024-02-08 MED ORDER — INSULIN REGULAR HUMAN 100 UNIT/ML IJ SOLN
100 | Freq: Once | INTRAMUSCULAR | Status: DC | PRN
Start: 2024-02-08 — End: 2024-02-08
  Administered 2024-02-08: 20:00:00 5 via INTRAVENOUS

## 2024-02-08 MED ORDER — MAGNESIUM SULFATE 2 GM/50ML IV SOLN
2 | INTRAVENOUS | Status: DC | PRN
Start: 2024-02-08 — End: 2024-02-09

## 2024-02-08 MED ORDER — FENTANYL CITRATE (PF) 100 MCG/2ML IJ SOLN
100 | INTRAMUSCULAR | Status: AC
Start: 2024-02-08 — End: 2024-02-08

## 2024-02-08 MED ORDER — INSULIN LISPRO 100 UNIT/ML IJ SOLN
100 | Freq: Three times a day (TID) | INTRAMUSCULAR | Status: DC
Start: 2024-02-08 — End: 2024-02-08

## 2024-02-08 MED ORDER — WARFARIN SODIUM 5 MG PO TABS
5 | Freq: Once | ORAL | Status: AC
Start: 2024-02-08 — End: 2024-02-08
  Administered 2024-02-09: 02:00:00 10 mg via ORAL

## 2024-02-08 MED ORDER — HEPARIN SODIUM (PORCINE) 1000 UNIT/ML IJ SOLN
1000 | INTRAMUSCULAR | Status: AC
Start: 2024-02-08 — End: 2024-02-08

## 2024-02-08 MED ORDER — ROCURONIUM BROMIDE 50 MG/5ML IV SOLN
50 | Freq: Once | INTRAVENOUS | Status: DC | PRN
Start: 2024-02-08 — End: 2024-02-08
  Administered 2024-02-08: 20:00:00 80 via INTRAVENOUS
  Administered 2024-02-08: 21:00:00 20 via INTRAVENOUS

## 2024-02-08 MED ORDER — POLYETHYLENE GLYCOL 3350 17 G PO PACK
17 | Freq: Every day | ORAL | Status: DC | PRN
Start: 2024-02-08 — End: 2024-02-09

## 2024-02-08 MED ORDER — HYDRALAZINE HCL 20 MG/ML IJ SOLN
20 | INTRAMUSCULAR | Status: DC | PRN
Start: 2024-02-08 — End: 2024-02-08

## 2024-02-08 MED ORDER — BUPIVACAINE HCL (PF) 0.5 % IJ SOLN
0.5 | INTRAMUSCULAR | Status: AC
Start: 2024-02-08 — End: 2024-02-08

## 2024-02-08 MED ORDER — LORAZEPAM 0.5 MG PO TABS
0.5 | ORAL | Status: DC | PRN
Start: 2024-02-08 — End: 2024-02-09
  Administered 2024-02-08: 0.5 mg via ORAL

## 2024-02-08 MED ORDER — INSULIN REGULAR(HUMAN) IN NACL 100-0.9 UT/100ML-% IV SOLN
100-0.9 | INTRAVENOUS | Status: DC | PRN
Start: 2024-02-08 — End: 2024-02-08
  Administered 2024-02-08: 21:00:00 1 via INTRAVENOUS

## 2024-02-08 MED ORDER — KETOROLAC TROMETHAMINE 30 MG/ML IJ SOLN
30 | Freq: Once | INTRAMUSCULAR | Status: DC | PRN
Start: 2024-02-08 — End: 2024-02-08
  Administered 2024-02-08: 21:00:00 30 via INTRAVENOUS

## 2024-02-08 MED ORDER — SODIUM CHLORIDE 0.9 % IV SOLN
0.9 | INTRAVENOUS | Status: DC | PRN
Start: 2024-02-08 — End: 2024-02-08
  Administered 2024-02-08: 20:00:00 via INTRAVENOUS

## 2024-02-08 MED ORDER — ONDANSETRON HCL 4 MG/2ML IJ SOLN
4 | Freq: Once | INTRAMUSCULAR | Status: DC | PRN
Start: 2024-02-08 — End: 2024-02-08

## 2024-02-08 MED ORDER — BUPIVACAINE HCL (PF) 0.5 % IJ SOLN
0.5 | INTRAMUSCULAR | Status: DC | PRN
Start: 2024-02-08 — End: 2024-02-08
  Administered 2024-02-08: 20:00:00 10 via INTRADERMAL

## 2024-02-08 MED FILL — FENTANYL CITRATE (PF) 100 MCG/2ML IJ SOLN: 100 MCG/2ML | INTRAMUSCULAR | Qty: 2

## 2024-02-08 MED FILL — LORAZEPAM 0.5 MG PO TABS: 0.5 mg | ORAL | Qty: 1

## 2024-02-08 MED FILL — SENSORCAINE-MPF 0.5 % IJ SOLN: 0.5 % | INTRAMUSCULAR | Qty: 30

## 2024-02-08 MED FILL — HEPARIN SODIUM (PORCINE) 1000 UNIT/ML IJ SOLN: 1000 [IU]/mL | INTRAMUSCULAR | Qty: 30

## 2024-02-08 MED FILL — PROTAMINE SULFATE 10 MG/ML IV SOLN: 10 mg/mL | INTRAVENOUS | Qty: 10

## 2024-02-08 MED FILL — MELATONIN 3 MG PO TABS: 3 mg | ORAL | Qty: 1

## 2024-02-08 MED FILL — POLYETHYLENE GLYCOL 3350 17 G PO PACK: 17 g | ORAL | Qty: 1

## 2024-02-08 NOTE — H&P (Signed)
 Update History & Physical    The patient's History and Physical of January 27, 2024 was reviewed with the patient and I examined the patient. There was no change. The surgical site was confirmed by the patient and me.  Patient presents today for outpatient A-fib ablation.    Is on Jantoven (previously on Eliquis, switched due to insurance reasons).  Last dose was last night.  Medications held appropriately.  INR from 02/06/2024 was 2.0 repeat labs shows an INR of 2.0.      Plan: The risks, benefits, expected outcome, and alternative to the recommended procedure have been discussed with the patient. Patient understands and wants to proceed with the procedure.     Electronically signed by Lorane FORBES Liner, FNP on 02/08/2024 at 1:56 PM

## 2024-02-08 NOTE — Anesthesia Post-Procedure Evaluation (Signed)
 Department of Anesthesiology  Postprocedure Note    Patient: Justin [name]  MRN: [id number]  Birthdate: [birth year]  Date of evaluation: 02/08/2024    Procedure Summary       Date: 02/08/24 Room / Location: [facility] EP LAB 1 / [facility] CARDIAC CATH/EP LAB    Anesthesia Start: 1554 Anesthesia Stop: 1743    Procedure: Ablation A-fib w complete ep study Diagnosis:       PAF (paroxysmal atrial fibrillation) (HCC)      (PAF (paroxysmal atrial fibrillation) (HCC) [I48.0])    Providers: Delmar Ozell BRAVO, MD Responsible Provider: Andriette Tresea Fallow, MD    Anesthesia Type: General ASA Status: 3            Anesthesia Type: General    Aldrete Phase I:      Aldrete Phase II:      Anesthesia Post Evaluation    Patient location during evaluation: PACU  Patient participation: complete - patient participated  Level of consciousness: awake  Airway patency: patent  Nausea & Vomiting: no nausea and no vomiting  Cardiovascular status: hemodynamically stable and blood pressure returned to baseline  Respiratory status: acceptable, nonlabored ventilation and spontaneous ventilation  Hydration status: euvolemic  Comments: Vital signs in PACU reviewed by anesthesia provider and/or documented in Handoff.  Multimodal analgesia pain management approach  Pain management: adequate        No notable events documented.

## 2024-02-08 NOTE — Anesthesia Procedure Notes (Signed)
 Airway  Date/Time: 02/08/2024 4:07 PM  Reason: elective    Airway not difficult    General Information and Staff   Patient location during procedure: OR  Anesthesiologist: Cindra Juliene Bills, MD  Performed: anesthesiologist   Performed by: Cindra Juliene Bills, MD  Authorized by: Cindra Juliene Bills, MD        Patient Condition  Indications for airway management: anesthesia  Patient position: sniffing  Sedation level: deep     Final Airway Details   Preoxygenated: yes  Final airway type: endotracheal airway  Successful airway: ETT  Cuffed: yes   Successful intubation technique: direct laryngoscopy  Adjuncts used in placement: intubating stylet  Endotracheal tube insertion site: oral  Blade: Macintosh  Blade size: #3  ETT size (mm): 7.5  Cormack-Lehane Classification: grade I - full view of glottis  Placement verified by: chest auscultation and capnometry   Measured from: lips  ETT to lips (cm): 22  Ventilation between attempts: bag mask  Number of attempts at approach: 1  Number of other approaches attempted: 0    Additional Comments  ASA monitors applied. Patient preoxygenated. IV induction. Eyes taped. Easy mask ventilation. Direct laryngoscopy, 1 attempt. Easy, atraumatic intubation with oral ETT. Positive ETCO2 detected, ETT secured. Patient positioned, head & neck supported. All pressure points padded.  no

## 2024-02-08 NOTE — Anesthesia Pre-Procedure Evaluation (Signed)
 Department of Anesthesiology  Preprocedure Note       Name:  Justin [name]   Age:  58 y.o.  DOB:  1965-09-29                                          MRN:  [id number]         Date:  02/08/2024      Surgeon: Clotilde):  Delmar Ozell BRAVO, MD    Procedure: Procedure(s):  Ablation A-fib w complete ep study    Medications prior to admission:   Prior to Admission medications   Medication Sig Start Date End Date Taking? Authorizing Provider   JANTOVEN 5 MG tablet Take 1.5 tablets by mouth daily 11/13/22  Yes [provider]   losartan-hydroCHLOROthiazide (HYZAAR) 100-12.5 MG per tablet Take 1 tablet by mouth daily 10/20/22  Yes [provider]   famotidine (PEPCID) 20 MG tablet Take 1 tablet by mouth 08/22/20  Yes [provider]   metoprolol  succinate (TOPROL  XL) 50 MG extended release tablet Take 1 tablet by mouth 08/22/20   [provider]   amiodarone  (CORDARONE ) 200 MG tablet Take 0.5 tablets by mouth daily 07/12/18   [provider]   PRAVACHOL 40 MG tablet Take 1 tablet by mouth daily 07/12/18   [provider]   metFORMIN (GLUCOPHAGE) 500 MG tablet Take 1 tablet by mouth 2 times daily (with meals) 11/13/22   [provider]   aspirin  81 MG EC tablet Take 1 tablet by mouth 08/22/20   [provider]       Current medications:    Current Facility-Administered Medications   Medication Dose Route Frequency Provider Last Rate Last Admin   . 0.9 % sodium chloride infusion   IntraVENous Continuous Haseley, Alexandra E, FNP 50 mL/hr at 02/08/24 1326 New Bag at 02/08/24 1326   . sodium chloride flush 0.9 % injection 5-40 mL  5-40 mL IntraVENous 2 times per day Haseley, Alexandra E, FNP       . sodium chloride flush 0.9 % injection 5-40 mL  5-40 mL IntraVENous PRN Haseley, Alexandra E, FNP       . 0.9 % sodium chloride infusion   IntraVENous PRN Haseley, Alexandra E, FNP           Allergies:    Allergies   Allergen Reactions   . Penicillins Hives       Problem  List:    Patient Active Problem List   Diagnosis Code   . PAF (paroxysmal atrial fibrillation) (HCC) I48.0       Past Medical History:        Diagnosis Date   . Atrial fibrillation (HCC)    . Diabetes mellitus (HCC)    . Hyperlipidemia    . Hypertension        Past Surgical History:        Procedure Laterality Date   . CARDIAC CATHETERIZATION     . HERNIA REPAIR         Social History:    Social History     Tobacco Use   . Smoking status: Every Day     Current packs/day: 0.25     Average packs/day: 0.8 packs/day for 41.6 years (32.2 ttl pk-yrs)     Types: Cigarettes     Start date: 1984     Passive exposure: Past   .  Smokeless tobacco: Never   Substance Use Topics   . Alcohol use: Not Currently                                Ready to quit: Not Answered  Counseling given: Not Answered      Vital Signs (Current):   Vitals:    02/08/24 1320 02/08/24 1330 02/08/24 1334 02/08/24 1400   BP: (!) 181/86   132/89   Pulse:  69  67   Resp:  14  12   Temp:   98.4 F (36.9 C)    TempSrc:   Oral    Weight:   77.1 kg (170 lb)    Height:   1.753 m (5' 9)                                               BP Readings from Last 3 Encounters:   02/08/24 132/89   01/27/24 138/78   12/15/23 (!) 136/95       NPO Status: Time of last liquid consumption: 1000 (sip of mountain dew)                        Time of last solid consumption: 2000                        Date of last liquid consumption: 02/08/24                        Date of last solid food consumption: 02/07/24    BMI:   Wt Readings from Last 3 Encounters:   02/08/24 77.1 kg (170 lb)   01/27/24 80.7 kg (178 lb)   12/15/23 80 kg (176 lb 5.9 oz)     Body mass index is 25.1 kg/m.    CBC:   Lab Results   Component Value Date/Time    WBC 9.6 02/06/2024 08:58 AM    RBC 5.34 02/06/2024 08:58 AM    HGB 14.2 02/06/2024 08:58 AM    HCT 45.6 02/06/2024 08:58 AM    MCV 85.4 02/06/2024 08:58 AM    RDW 13.9 02/06/2024 08:58 AM    PLT 311 02/06/2024 08:58 AM       CMP:   Lab Results   Component  Value Date/Time    NA 137 02/06/2024 08:58 AM    K 4.9 02/06/2024 08:58 AM    CL 103 02/06/2024 08:58 AM    CO2 21 02/06/2024 08:58 AM    BUN 30 02/06/2024 08:58 AM    CREATININE 1.5 02/06/2024 08:58 AM    LABGLOM 54 02/06/2024 08:58 AM    LABGLOM 71 02/28/2022 08:26 AM    GLUCOSE 321 02/06/2024 08:58 AM    CALCIUM 9.7 02/06/2024 08:58 AM    BILITOT 0.32 05/02/2023 09:07 AM    ALKPHOS 81 05/02/2023 09:07 AM    AST <10 05/02/2023 09:07 AM    ALT 11 05/02/2023 09:07 AM       POC Tests:   Recent Labs     02/08/24  1507   POCGLU 214.0*       Coags:   Lab Results   Component Value Date/Time    PROTIME 22.4 02/08/2024 01:19 PM  INR 2.0 02/08/2024 01:19 PM    APTT 38.0 12/15/2023 09:45 AM       HCG (If Applicable): No results found for: PREGTESTUR, PREGSERUM, HCG, HCGQUANT     ABGs: No results found for: PHART, PO2ART, PCO2ART, HCO3ART, BEART, O2SATART     Type & Screen (If Applicable):  No results found for: ABORH, LABANTI    Drug/Infectious Status (If Applicable):  No results found for: HIV, HEPCAB    COVID-19 Screening (If Applicable): No results found for: COVID19        Anesthesia Evaluation     no history of anesthetic complications:   Airway: Mallampati: [name]  TM distance: >3 FB   Neck ROM: full  Mouth opening: > = 3 FB   Dental:    (+) poor dentition      Pulmonary:normal exam  breath sounds clear to auscultation  (+)     sleep apnea: on noncompliant,       current smoker          Patient did not smoke on day of surgery.                 Cardiovascular:    (+) hypertension:, dysrhythmias: atrial fibrillation        Rhythm: regular  Rate: normal                    Neuro/Psych:   Negative Neuro/Psych ROS              GI/Hepatic/Renal:   (+) renal disease: CRI          Endo/Other:    (+) DiabetesType II DM, poorly controlled.                 Abdominal: normal exam            Vascular: negative vascular ROS.         Other Findings:           Anesthesia Plan      general     ASA 3        Induction: intravenous.    MIPS: Postoperative opioids intended and Prophylactic antiemetics administered.  Anesthetic plan and risks discussed with patient and child/children.                      Juliene Hosey Jointer, MD   02/08/2024

## 2024-02-08 NOTE — Anesthesia Procedure Notes (Signed)
 Procedure Performed: TEE       Start Time:  02/08/2024 4:13 PM       End Time:      Anesthesia Information  Performed Personally  Anesthesiologist:  Cindra Juliene Bills, MD      Echocardiogram Comments:       Intraoperative TEE for atrial fibrillation ablation.  Adequate study quality.  Dr. Delmar notified of the results in person at the time of the study.    Preanesthesia Checklist:  Patient identified, IV assessed, risks and benefits discussed, monitors and equipment assessed, procedure being performed at surgeon's request and anesthesia consent obtained.    General Procedure Information  Diagnostic Indications for Echo:  assessment of ascending aorta, assessment of surgical repair, hemodynamic monitoring and assessment of valve function  Physician Requesting Echo: Delmar Ozell BRAVO, MD  CPT Code:  06687  ICD Code for Medical Necessity:  I48.0, I35.1  Location performed:  [facility] procedure area  Intubated  Bite block placed  Heart visualized  Probe Insertion:  Easy  Probe Type:  Mulitplane  Modalities:  2D, color flow mapping, continuous wave Doppler and pulse wave Doppler    Echocardiographic and Doppler Measurements    Ventricles    Ventricle  Cavity Size  Cavity          Dimension  Hypertrophy  Thrombus  Global FXN  EF    RV  normal    No  No  normal      LV  normal    Yes  No  normal (50-70%)  55%     Ventricular Findings Comments:  Right Ventricle -  Left Ventricle -        Valves     Valves  Annulus  Stenosis Measurements   Regurg  Leaflet   Morph  Leaflet   Motion Valve Comments    Aortic calcified Stenosis none.   Area:2.2 cm   Peak Gradient:14 mmHg  Mean Gradient: 5 mmHg    trace   calcified, thickened normal       Mitral calcified none     Peak Gradient:4 mmHg   Mean Gradient:1 mmHg     mild thickened normal    Tricuspid normal   not present         mild   thickened   normal      Pulmonic  not present         trace   thickened           Aorta     Aorta  Size  Diam(cm)  Dissection Katz Classification     Ascending normal   3.3 cm  II    Arch normal     III   Descending normal   2.1 cm   III             Atria     Size  SEC (smoke)  Thrombus  Tumor  Device    Rt Atrium normal No.   No.   No.   No.        Lt Atrium dilated No.   No No No.         Left Atrial Appendage: normal        Septa    Interatrial Septal Morphology: normal          Interventricular Septal Morphology: normal          Diastolic Function Measurements:  Diastolic Dysfunction Grade= I (Delayed relaxation)  E=  ms  A=  ms  E/A Ratio=   DT=  ms  S/D=   IVRT=    Other Findings  Pericardium:  pericardial effusion  Pleural Effusion:  none  Pulmonary Arteries:  normal  Pulmonary Venous Flow:  normal  Post Intervention Follow-up Study         Valve  Function  Regurgitation  Area Prosthetic?    Aortic        Mitral        Tricuspid        Prosthetic         None  Comments: Small pericardial effusion

## 2024-02-08 NOTE — Discharge Instructions (Signed)
 ATRIAL FIBRILLATION PROCEDURE DISCHARGE INSTRUCTIONS    ACTIVITY RESTRICTIONS    Rest, with light activity, for the first two days after the ablation. Do not return to work or drive during these two days.  Do not bend at the waist or squat for 48 hours. Do not sit for prolonged periods to prevent blood clots from forming at the wound sites.  Do not lift anything heavier than a gallon of milk for one week. No heavy activity for two weeks. Walking is encouraged however.    WOUND CARE    If the dressing is still applied to your groin site, it is okay to remove it the following day. It is okay to shower and clean with soap and water .  Keep your groin access site clean and dry. Do not apply lotions or ointments to this area.  Do not soak your wound sites under water  for one week. No tub baths, hot tubs or swimming for one week. Showering is okay.    MEDICATION INSTRUCTIONS    Continue taking your blood thinner.  This is important.  Do not stop your blood thinner unless instructed to do so by our office.  For general discomfort, you may take one or two acetaminophen  (Tylenol ) 325 mg tablets every four hours, but do not exceed 4000 mg in 24 hours.    WHAT TO EXPECT    You may have mild chest pain the day after the ablation.    Some patients get headaches for several days after the ablation.  There can be exacerbation of pre-existing migraines too.  This can be treated with Tylenol  and sometimes caffeine and hydration help with the headaches.  The headaches should not be associated with any neurological deficits such as loss of vision or weakness or numbness so please notify the office if these other symptoms occur.    Regarding the groin sites. you may have mild soreness in your groin area.  Sometimes there is some bruising or purple color seen in the area where the catheters were placed in the groins.  These purple areas can extend or expand down the thigh for several days after the procedure.  This is normal as long as  you do not have any areas or pain or swelling in the groin sites.     Also, it is normal to have some palpitations, including periods of atrial fibrillation, or other arrhythmias during the first few weeks or months after the ablation.  This is part of the 'healing' from the ablation as the heart returns to normal.  Sometimes the resting heart rate also may be a little bit elevated (about 10 beats per minute more than prior to the ablation) for a while.    WHEN TO CALL    If you develop any problems or if arrhythmias last longer than 24 hours but well tolerated or similar to prior episodes of atrial fibrillation, please let us  know by calling our office [phone]. Often these arrhythmias will convert back to normal on their own but if they persist for several days we will likely want to do a cardioversion.    Seek immediate medical attention if you develop swelling, pain, bleeding, redness, difficulty breathing, worsening chest pain, or if you pass out.   Please inform our office if you have new or worsening symptoms of:  Difficulty swallowing  Tasting or coughing up blood  Chest pain or back pain  Fevers  Neurological symptoms (i.e. Slurred speech, weakness, numbness, loss of words)  Passing out  Any other symptoms are new or concerning to you    In very rare cases there are delayed complications from an ablation that can have warning symptoms as far out as 2-4 weeks after an ablation    Please contact us  immediately to let us  know about these symptoms even if you are going to the emergency room, as we need to be in contact with your doctors regarding your care to make sure the proper test and interventions are performed.    Cardiac Electrophysiology  [facility]  [place], [place]   Phone [phone]  Fax [phone]

## 2024-02-08 NOTE — Procedures (Signed)
 ELECTROPHYSIOLOGY PROCEDURE NOTE                                                                                           Date:  February 08, 2024  Patient name: Justin [name]  Date of Birth: [birth year]    ATTENDING: Ozell FORBES Alexander, MD    Diagnosis:     1.   Atrial Fibrillation, paroxysmal      Procedures Performed:  1.  AF ablation - 93656  2. Additional mechanism of SVT/tachycardia - 06344     Description of Procedure:     The risk, benefits, and alternatives of electrophysiology study with ablation were discussed with the patient. Informed consent was signed, and the patient was brought to the electrophysiology laboratory in the fasting state.  The patient was placed under general anesthesia with endotracheal intubation.  No foley or arterial line was placed.  See anesthesia records.    Heparin was administered after intubation and prior to groin access.    Local anesthetic was infiltrated over the right femoral region and the right femoral vein was entered once using ultrasound guided technique leaving a guidewire in position.  One 69F sheath was then placed over the guide wire and the guidewire and dilator was removed from the body. The sheaths were then aspirated and flushed.    Local anesthetic was infiltrated over the left femoral regions and the left femoral vein was entered two times using ultrasound guidance technique. One 10 french and one 8 french sheaths were passed over the retained guidewires and the guidewires and dilators were removed from the body. These sheaths were aspirated and flushed.    CATHETER PLACEMENT:  1)     Deflectable decapolar medium sweep EZ steer CS catheter advanced from the left femoral vein to the coronary sinus   2)     Versacross 8.5 F long sheath from RFV to RA and then transseptal with the Versacross RF wire / system and then exchanged for Agilis medium curve  3)     ICE St Jude viewflex 10 catheter via 10 F long sheath.  4)     Affera Sphere 9 catheter via Agilis  sheath     EP study:  Baseline Rhythm:The patient entered the lab in sinus rhythm with a right bundle branch block.  HV interval was 45 ms.    ACT was checked prior to introducing the Sphere-9 catheter into the body and ACT maintained > 350 seconds for the target.    Additional mechanism of SVT/atrial flutter #1: Given the prior typical flutter documented on ECG, an atrial flutter line was performed.  Using the Sphere 9 catheter in the 80% max current setting and the Agilis long sheath, a line of RF was drawn from the tricuspid valve to the IVC at 6 o'clock using ICE to verify contact and occasional breath hold on the ventricular side of the line.  Bidirectional block was obtained.  Differential pacing was used to demonstrate bidirectional block.    TRANSEPTAL PUNTURE:   The Agilis was exchanged back to the Versacross 8.5 F fixed curve sheath and dilator was placed  over a long wire for the 8 FR sheath in the RFV. The tip of this dilator was taken to the level of the RA. Under ultrasound guidance and with fluoroscopy in the LAO 30 degree position the tip of the dilator was pulled down the intra-arterial septum until there was tenting seen at the foramen ovale. The RF wire (pigtail) was used to cross the septum and placed in the left upper PV. The wire was retained in the LA and the dilator was removed and the transseptal sheath was then aspirated and flushed with heparinized saline. ICE showed no sign of pericardial effusion.   Over the long wire, the Versacross sheath was exchanged to the Agilis sheath.  The Sphere 9 ablation catheter was introduced into the Agilis medium sheath using the insertion tool and advanced a few centimeters.  The side port of the sheath was aspirated and cleared of any air and then flushed.  A continuous flush line was used to continue constant low flow on the transseptal sheath.    GEOMETRY:   A three dimension geometry was then created utilizing the Affera Sphere 9 catheter and the  Affera mapping system. Specific identification of the pulmonary veins and left atrial appendage as well as the LA body was mapped.  Once a good correlating geometry was completed attention was turned the pulse Bartlett Enke lesion delivery.   We obtained an ACT of 350 seconds.     PULSE Sharnell Knight ABLATION:   Pulsed Yavuz Kirby ablation was used to create antral lesions circumferentially around the pulmonary veins on the left and then the right pulmonary veins to create a wide antral isolation.  Each carina was also targeted with PFA as well.  PVI was obtained.  A 3-D remap of the LA was created and pulmonary vein isolation was verified.  Occasional pacing from the 3-D mapping catheter was performed to verify exit block.  The posterior wall appeared to be healthy and we did not ablate the posterior wall.    Only pulsed Sharone Almond was used in the left atrium.    We then did burst pacing from the coronary sinus.  No inducible tachycardia was inducible post ablation.    At the end of the procedure the intracardiac echocardiogram showed no evidence of a pericardial effusion.    The final rhythm was sinus rhythm.    The catheters were pulled back from the LA and removed from the body. The sheaths were the aspirated and flushed. Once all the sheaths were within the RA the patient received IV protamine and the sheaths were then removed. Bleeding was controlled with a figure of 8 stitch on each side with a 3-way stopcock.    Estimated blood loss was less than 5 cc. There were no in lab complications.    The results of the procedure communicated to the patient's family and the referring physician. Postprocedure activity follow-up and medications were discussed at length with the patient.     Impression:  Successful pulmonary vein isolation procedure  CTI line performed as well    Recommendations:  Resume anticoagulation until adequate monitoring and EP follow-up.  EP follow-up 2-6 weeks.    Colene Mines E Anora Schwenke, MD

## 2024-02-09 ENCOUNTER — Encounter

## 2024-02-09 LAB — PROTIME-INR
INR: 2.3 (ref 1.5–3.5)
Protime: 25.3 s — ABNORMAL HIGH (ref 11.6–14.5)

## 2024-02-09 LAB — TEE ANESTHESIA INTRA OP ONLY: Body Surface Area: 1.94 m2

## 2024-02-09 LAB — CBC WITH AUTO DIFFERENTIAL
Basophils %: 0.2 % (ref 0.0–2.0)
Basophils Absolute: 0 x10e3/mcL (ref 0.0–0.2)
Eosinophils %: 0.1 % (ref 0.0–7.0)
Eosinophils Absolute: 0 x10e3/mcL (ref 0.0–0.5)
Hematocrit: 39.7 % (ref 38.0–52.0)
Hemoglobin: 13.3 g/dL (ref 13.0–17.3)
Immature Grans (Abs): 0.07 x10e3/mcL — ABNORMAL HIGH (ref 0.00–0.06)
Immature Granulocytes %: 0.4 % (ref 0.0–0.6)
Lymphocytes Absolute: 1.8 x10e3/mcL (ref 1.0–3.2)
Lymphocytes: 10.8 % — ABNORMAL LOW (ref 15.0–45.0)
MCH: 27.3 pg (ref 27.0–34.5)
MCHC: 33.5 g/dL (ref 30.0–36.0)
MCV: 81.4 fL — ABNORMAL LOW (ref 84.0–100.0)
MPV: 10 fL (ref 7.0–12.2)
Monocytes %: 6.4 % (ref 4.0–12.0)
Monocytes Absolute: 1.1 x10e3/mcL — ABNORMAL HIGH (ref 0.3–1.0)
NRBC Absolute: 0 x10e3/mcL (ref 0.000–0.012)
NRBC Automated: 0 % (ref 0.0–0.2)
Neutrophils %: 82.1 % — ABNORMAL HIGH (ref 42.0–74.0)
Neutrophils Absolute: 13.8 x10e3/mcL — ABNORMAL HIGH (ref 1.6–7.3)
Platelets: 309 x10e3/mcL (ref 140–440)
RBC: 4.88 x10e6/mcL (ref 4.00–5.60)
RDW: 14.1 % (ref 10.0–17.0)
WBC: 16.8 x10e3/mcL — ABNORMAL HIGH (ref 3.8–10.6)

## 2024-02-09 LAB — EKG 12-LEAD
P Axis: 66 degrees
P-R Interval: 166 ms
Q-T Interval: 407 ms
QRS Duration: 148 ms
QTc Calculation (Bazett): 451 ms
R Axis: 68 degrees
T Axis: 35 degrees
Ventricular Rate: 73 {beats}/min

## 2024-02-09 LAB — BASIC METABOLIC PANEL
Anion Gap: 13 mmol/L (ref 2–17)
BUN: 36 mg/dL — ABNORMAL HIGH (ref 6–20)
CO2: 17 mmol/L — ABNORMAL LOW (ref 22–29)
Calcium: 8.8 mg/dL (ref 8.5–10.7)
Chloride: 101 mmol/L (ref 98–107)
Creatinine: 1.7 mg/dL — ABNORMAL HIGH (ref 0.7–1.3)
Est, Glom Filt Rate: 46 mL/min/1.73m — ABNORMAL LOW (ref >=60)
Glucose: 312 mg/dL — ABNORMAL HIGH (ref 70–99)
Osmolaliy Calculated: 282 mosm/kg (ref 270–287)
Potassium: 4.5 mmol/L (ref 3.5–5.3)
Sodium: 131 mmol/L — ABNORMAL LOW (ref 135–145)

## 2024-02-09 LAB — POCT GLUCOSE
POC Glucose: 335 mg/dL — ABNORMAL HIGH (ref 65.0–110.0)
POC Glucose: 307 mg/dL — ABNORMAL HIGH (ref 65.0–110.0)
POC Glucose: 229 mg/dL — ABNORMAL HIGH (ref 65.0–110.0)

## 2024-02-09 LAB — HEMOGLOBIN A1C
Estimated Avg Glucose: 301
Estimated Avg Glucose: 353
Hemoglobin A1C: 12.1 % — ABNORMAL HIGH (ref 4.0–6.0)

## 2024-02-09 LAB — TSH REFLEX TO FT4: TSH: 0.517 u[IU]/mL (ref 0.358–3.740)

## 2024-02-09 MED ORDER — INSULIN PEN NEEDLE 32G X 4 MM MISC
3 refills | Status: AC
Start: 2024-02-09 — End: ?

## 2024-02-09 MED ORDER — LANTUS SOLOSTAR 100 UNIT/ML SC SOPN
100 | Freq: Every evening | SUBCUTANEOUS | 3 refills | 45.00000 days | Status: AC
Start: 2024-02-09 — End: ?

## 2024-02-09 MED FILL — METFORMIN HCL 500 MG PO TABS: 500 mg | ORAL | Qty: 1

## 2024-02-09 MED FILL — METOPROLOL SUCCINATE ER 50 MG PO TB24: 50 mg | ORAL | Qty: 1

## 2024-02-09 MED FILL — HUMALOG 100 UNIT/ML IJ SOLN: 100 [IU]/mL | INTRAMUSCULAR | Qty: 1

## 2024-02-09 MED FILL — MELATONIN 3 MG PO TABS: 3 mg | ORAL | Qty: 1

## 2024-02-09 MED FILL — PRAVASTATIN SODIUM 20 MG PO TABS: 20 mg | ORAL | Qty: 2

## 2024-02-09 MED FILL — JANTOVEN 5 MG PO TABS: 5 mg | ORAL | Qty: 2

## 2024-02-09 NOTE — Consults (Signed)
 PMH DM on Metformin as OP. Recent FSBS 307, A1C 12.1% dated 02/09/24, recall previous value of 9.5% dated 05/02/23 and 14% dated 09/07/22. BMI 25.10. [facility] consulted for elevated blood glucose/A1C. Pt states he was previously prescribed insulin via syringe/vial technique when initially diagnosed with diabetes, and declines need for review of insulin administration. Patient also endorses previously being prescribed Trulicity, and declines need for review of injection medications via kwikpen technique. Pt aware of current A1C level and therefor anticipates conversation regarding initiating insulin therapy at discharge and appears amenable to this. Pt states he has several glucometers somewhere at home, and declines need for review of proper technique on how to check glucose levels via glucometer. Counseling provided on importance of checking glucose levels frequently, and discussed importance of recognizing signs/symptoms of hypoglycemia and how to treat appropriately. DM Survival Skills, [employer], nutritional guidelines, BG testing, DM medication/insulin OP edu and f/u care literature provided and reviewed with patient. Pt could benefit from formal OP DM Edu, therefor referral sent to [facility] for Comprehensive DM Edu Class. Additional DM edu literature to include D[facility]'s office location information provided to patient via Epic in AVS. Pt has BCBS and dedicated pharmacy per chart review.

## 2024-02-09 NOTE — Consults (Signed)
 [facility] CONSULT NOTE    02/09/2024 2:02 PM    Patient Information: Justin [name]   Date of Admit:  02/08/2024  Primary Care Physician:  No, Pcp  Requesting Physician:  Delmar Ozell BRAVO, MD    Reason for consult:   Medical evaluation and recommendations for glycemic management, A1c 12.1%    History of Present Illness:  Justin [name] is a 58 y.o. White (non-Hispanic)  male with past medical history significant for diabetes, hyperlipidemia, hypertension and A-fib.  Patient was seen by electrophysiology for consultation of new onset A-fib.  He was seen in the ED back in [place] in May 2025 with an episode of A-fib and RVR with marked hypertension, he was advised to be admitted however he declined and preferred discharge with plans to follow-up with cardiology locally in [place].  He was prescribed metoprolol , amiodarone  and warfarin, and had another ED visit back in June with further palpitations and rapid heart rate sensation.  He was maintained on anticoagulation and beta-blocker and had follow-up with electrophysiology.  He was admitted yesterday for planned catheter ablation.  He underwent successful A-fib ablation yesterday, and is scheduled for discharge home today.    His A1c on labs today returned at 12.1% with creatinine at 1.7 and GFR at 46.  Endocrine was consulted to assist with glycemic management before discharge.  At time of visit his wife and daughter are at bedside and care discussed with them.  States he was diagnosed with diabetes approximately 19 years ago, has been treated as type II but states that his sugar has never been well-controlled.  He is currently on metformin monotherapy and not checking blood sugars at home.  He was previously following with [facility] in [facility] but has not seen them in over 3 years.  States he did not want to drive that distance and visits became too costly.  He was previously on GLP-1 with Trulicity but did not  tolerate with GI side effects and dehydration.  States he was on insulin briefly when he required a course of steroids but this was only temporary.  He was also previously on CGM but this stopped as he did not like the size.    He received intraoperative dexamethasone prior to ablation.  He is currently on sliding scale only with glucose still trending in the 300s.  Admits that his diet is not the best but willing to make changes.  He has had some degree of weight loss over the last several years unintentionally.  Occasionally drinks alcohol.  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    Of note, he is anxious to discharge today.  States he feels jittery overall but attests this to having underlying anxiety.  He has not had recent thyroid function studies since last November, with result of 1.6.  States he has only had issues with thyroid labs when he is on amiodarone , this was previously monitored by endocrinology.      REVIEW OF SYSTEMS: Except for HPI, remainder of 12 point review of systems negative    Past Medical History:   has a past medical history of Atrial fibrillation (HCC), Diabetes mellitus (HCC), Hyperlipidemia, and Hypertension.     Past Surgical History:   has a past surgical history that includes hernia repair and Cardiac catheterization.     Medications:   sodium chloride flush  5-40 mL IntraVENous 2 times per day    [Held by provider] metFORMIN  500 mg  Oral BID WC    metoprolol  succinate  50 mg Oral Daily    pravastatin  40 mg Oral Daily    sodium chloride flush  5-40 mL IntraVENous 2 times per day    insulin lispro  0-8 Units SubCUTAneous 4x Daily AC & HS       Allergies:  Allergies   Allergen Reactions    Penicillins Hives        Social History:   reports that he has been smoking cigarettes. He started smoking about 41 years ago. He has a 32.2 pack-year smoking history. He has been exposed to tobacco smoke. He has never used smokeless tobacco. He reports that he does not currently use alcohol. He  reports that he does not use drugs.     Family History:  family history is not on file.     Physical Exam:  BP (!) 138/91   Pulse 69   Temp 97.5 F (36.4 C) (Oral)   Resp 18   Ht 1.753 m (5' 9)   Wt 77.1 kg (170 lb)   SpO2 100%   BMI 25.10 kg/m     General appearance: Appears comfortable. Well nourished, NAD  Eyes: Sclera clear, pupils equal, no proptosis appreciated  ENT: Moist mucus membranes, no thrush  Neck: Trachea midline, symmetrical, no thyroid enlargement noted  Cardiovascular: Regular rate and rhythm. No murmur, gallop, rub.   Respiratory: Clear to auscultation bilaterally. No wheeze. Good inspiratory effort  Gastrointestinal: Abdomen soft, not tender, not distended, normal bowel sounds  Musculoskeletal: No cyanosis in digits, warm extremities  Neurologic: Cranial nerves grossly intact, no motor or speech deficits.  Psychiatric: Normal affect. Alert and oriented to time, place and person.  Extremities: 2+ pulses at dorsalis pedis bilaterally. No edema in  lower extremities  Skin: Warm, dry, normal turgor, no rash      Labs:BMP:   Lab Results   Component Value Date/Time    NA 131 02/09/2024 07:12 AM    K 4.5 02/09/2024 07:12 AM    CL 101 02/09/2024 07:12 AM    CO2 17 02/09/2024 07:12 AM    BUN 36 02/09/2024 07:12 AM    CREATININE 1.7 02/09/2024 07:12 AM    GLUCOSE 312 02/09/2024 07:12 AM    CALCIUM 8.8 02/09/2024 07:12 AM      A1C:   Hemoglobin A1C   Date Value Ref Range Status   02/09/2024 12.1 (H) 4.0 - 6.0 % Final     Comment:     HEMOGLOBIN A1C INTERPRETATION:    The following arbitrary ranges may be used for interpretation of the results.  However, factors such as duration of diabetes, adherence to therapy, and  patient age should also be considered in assessing degree of blood glucose  control.    Hemoglobin A1C                 Avg. Blood Sugar  --------------------------------------------------------------  6%                           135 mg/dL  7%                           170 mg/dL  8%                            205 mg/dL  9%  240 mg/dL  89%                          275 mg/dL    ======================================================    A1C                      Glucose Control  ----------------------------------------------------------------  < 6.0 %                   Normal  6.0 - 6.9 %               Abnormal  7.0 - 7.9 %               Sub-Optimal Control  > 8.0 %                   Inadequate Control        CMP:   Lab Results   Component Value Date/Time    NA 131 02/09/2024 07:12 AM    K 4.5 02/09/2024 07:12 AM    CL 101 02/09/2024 07:12 AM    CO2 17 02/09/2024 07:12 AM    BUN 36 02/09/2024 07:12 AM    CREATININE 1.7 02/09/2024 07:12 AM    GLUCOSE 312 02/09/2024 07:12 AM    CALCIUM 8.8 02/09/2024 07:12 AM    BILITOT 0.32 05/02/2023 09:07 AM    ALKPHOS 81 05/02/2023 09:07 AM    AST <10 05/02/2023 09:07 AM    ALT 11 05/02/2023 09:07 AM      Last 3 POC Glucose:   Recent Labs     02/08/24  2105 02/09/24  0800 02/09/24  1245   POCGLU 335.0* 307.0* 229.0*      TFTs:   Lab Results   Component Value Date    TSH 0.517 02/09/2024        Assessment & Plan:     Type 2 diabetes, poorly controlled    58 year old man with paroxysmal A-fib, admitted for ablation which was successfully completed yesterday.  Found to have hyperglycemia with A1c 12.1%, known longstanding diabetes but no recent follow-up with endocrinology.  On metformin monotherapy at home.    Lengthy discussion with patient and family regarding need to improve glycemic control at this time, we discussed his general feelings of malaise may certainly be contributed by chronic hyperglycemia.  He has not had prior antibody testing to his knowledge or had any prior workup for LADA however with weight loss, poor glycemic control, recommend sending off for antibodies before discharge.  GAD was ordered and we will follow-up with results    Given hyperglycemia and concern for potential LADA, we discussed insulin as best treatment  option now, particularly with intolerance to GLP-1 in the past and rising creatinine with limitations to continuing metformin in the short-term.  He is in agreement with plan    For discharge:  -Start Lantus 15 units once daily, he can utilize prefilled insulin pen with administration and dosing instructions provided  -Start freestyle libre CGM, sample provided today, will use this for the next 15 to 30 days to track glucose, as we discussed he will likely need up titration in basal insulin    - Reviewed pre and postprandial target glucose values, he will likely still be elevated postprandially, however will await gad antibodies to determine further treatment  -He agrees to contact the office with updated glucose trends and we can add short acting insulin if indicated  -  Hold metformin in the short-term given elevated creatinine although likely AKI from anesthesia, will plan to recheck BMP at endocrine follow-up and can restart metformin if indicated    Reviewed proper treatment of hypoglycemia if this occurs    Prescriptions have been sent  He will follow-up with me in the office in 4 weeks for ongoing endocrine care  All of his questions have been answered       Thank you for the opportunity to participate in the care of your patient.    Ronal FORBES Radish, APRN - NP   02/09/2024 2:02 PM

## 2024-02-09 NOTE — Telephone Encounter (Signed)
 Patient seen in hospital for diabetes consult, can we please schedule him to see me for follow-up in 4 weeks, can be virtual, wherever you see a space is fine

## 2024-02-09 NOTE — Care Coordination-Inpatient (Signed)
 02/09/24 1258   Service Assessment   Patient Orientation Alert and Oriented   Cognition Alert   History Provided By Medical Record   Primary Caregiver Self   Support Systems Spouse/Significant Other   Patient's Healthcare Decision Maker is: Legal Next of Kin   PCP Verified by CM No   Can patient return to prior living arrangement Yes   Ability to make needs known: Good   Social/Functional History   Type of Home Apartment   Home Equipment None   Ambulation Assistance Independent   Prior Level of Assist for Transfers Independent   Active Driver Yes   Mode of Engineer, water   Occupation Full time employment   Discharge Planning   Type of Residence House   Living Arrangements Spouse/Significant Other   Current Services Prior To Admission None   Potential Assistance Needed N/A   Type of Home Care Services None   Patient expects to be discharged to: [facility] At/After Discharge   Transition of Care Consult (CM Consult) N/A   Services At/After Discharge None   Mode of Transport at Discharge Self   Condition of Participation: Discharge Planning   The Plan for Transition of Care is related to the following treatment goals: Home no services

## 2024-02-09 NOTE — Telephone Encounter (Signed)
 Perfect, thank you, can you send to scheduling to call patient with the appointment time?

## 2024-02-09 NOTE — Discharge Summary (Signed)
 Electrophysiology Discharge Summary      PATIENT NAME: Justin [name]  MRN: [id number]  ADMISSION DATE: 02/08/2024  DISCHARGE DATE: 02/09/2024  ATTENDING PHYSICIAN: Dr Ozell Alexander  REFERRING PHYSICIAN:   PRIMARY CARE PHYSICIAN: No, Pcp  SERVICE: Cardiac Electrophysiology    ADMITTING DIAGNOSIS:  Atrial Fibrillation    PROCEDURES/STUDIES DURING HOSPITALIZATION  Atrial Fibrillation Ablation    HISTORY OF PRESENT ILLNESS  Justin [name] is a 58 year old male.  Presented yesterday for outpatient ablation with Dr. Alexander.        He had an ER visit recently at [facility] 12/15/2023.  He experiences arrhythmia 3 to 4 times a week, describing it as a sensation of his heart being squeezed. He has never lost consciousness due to his condition. He identifies alcohol and stress as triggers for his symptoms and has been under significant financial stress recently. He avoids alcohol and vitamin K in his diet. He maintains a low-salt diet, drinks plenty of water, and limits his caffeine intake. [name]      He was seen in the emergency room in [place] on Nov 16, 2023 was markedly hypertensive and in atrial fibrillation with RVR he was given a bolus of amiodarone  converted back to sinus rhythm.  Systolic blood pressure at the time was 212/87.  The emergency room doctor recommended he stay but he had the flight the next morning and was discharged with plans for follow-up locally in [place] .       Outside ECG from [facility] [place]  Nov 16, 2023 shows atrial fibrillation with RVR to 148 bpm.  This might even be atrial flutter although it is hard to see P waves.  According to the documentation it looks like he left emergency room AMA.     He recalls an episode of atrial fibrillation in [place] after walking from his hotel to downtown, which was unusual as he typically does not tire easily from walking. He also mentions that his wife, who has agoraphobia, is not always able to accompany him to the ER, leading him to drive himself there on  occasion.     He is currently on metoprolol , amiodarone , and warfarin. He notes that if he misses two days of amiodarone , his heart rate increases to around 67 beats per minute. If he misses two days of metoprolol , he goes into atrial fibrillation. He was previously on Eliquis but switched to warfarin due to insurance issues.    See below for procedure details.    Past Medical History:   Diagnosis Date    Atrial fibrillation (HCC)     Diabetes mellitus (HCC)     Hyperlipidemia     Hypertension        Past Surgical History:   Procedure Laterality Date    CARDIAC CATHETERIZATION      HERNIA REPAIR         Social History     Socioeconomic History    Marital status: Married     Spouse name: Not on file    Number of children: Not on file    Years of education: Not on file    Highest education level: Not on file   Occupational History    Not on file   Tobacco Use    Smoking status: Every Day     Current packs/day: 0.25     Average packs/day: 0.8 packs/day for 41.6 years (32.2 ttl pk-yrs)     Types: Cigarettes     Start date: 16     Passive exposure:  Past    Smokeless tobacco: Never   Substance and Sexual Activity    Alcohol use: Not Currently    Drug use: Never    Sexual activity: Not on file   Other Topics Concern    Not on file   Social History Narrative    Not on file     Social Drivers of Health     Financial Resource Strain: Not on file   Food Insecurity: No Food Insecurity (02/08/2024)    Hunger Vital Sign     Worried About Running Out of Food in the Last Year: Never true     Ran Out of Food in the Last Year: Never true   Transportation Needs: No Transportation Needs (02/08/2024)    PRAPARE - Therapist, art (Medical): No     Lack of Transportation (Non-Medical): No   Physical Activity: Not on file   Stress: Not on file   Social Connections: Not on file   Intimate Partner Violence: Not on file   Housing Stability: Low Risk  (02/08/2024)    Housing Stability Vital Sign     Unable to Pay for  Housing in the Last Year: No     Number of Times Moved in the Last Year: 0     Homeless in the Last Year: No       History reviewed. No pertinent family history.    ALLERGIES:  Allergies   Allergen Reactions    [facility] Course  On 02/08/2024, the patient presented from home for elective catheter ablation. Upon arrival to the electrophysiology lab, the patient was in sinus rhythm. He underwent successful atrial fibrillation (pulmonary vein isolation/CTI line) ablation using general anesthesia. The procedure was without complications and the patient stayed overnight per routine for monitoring.    The morning following the procedure, he was recovering well. Specifically, he denied chest pain, palpitations, shortness of breath, dysuria, dysphagia, lightheadedness or dizziness.    Telemetry revealed a sinus rhythm with no events or atrial fibrillation overnight. Upon physical examination, his heart had a regular rate and rhythm. Lungs were clear to auscultation bilaterally. Bilateral groin incisions were found to be clean, dry, and intact without hematomas.    Review of labs this morning showed a hemoglobin A1c of 12.1.  Had a slight decline in renal function.  Creatinine 1.7 and GFR 46.  Place consult for diabetes education and endocrinology.    Patient said he previously followed with endocrinologist but has not seen them in approximately 3 years.  He has been on metformin but this has been discontinued/held due to bump in renal function.    Endocrinology did see him here and while inpatient.  Adjusted medications.  Again metformin has been discontinued and insulin added.  He will follow-up outpatient with the new nephrology team.    I also have held his losartan hydrochlorothiazide due to renal dysfunction.  He will need to continue to monitor blood pressure at home and we may be able to reinitiate this in the future.    He tells me that he does not have a primary care provider but he will be  obtaining one is soon as possible.    Will need to have the labs repeated in a few weeks.    LABS AT DISCHARGE  Lab Results   Component Value Date    WBC 16.8 (H) 02/09/2024    HGB 13.3 02/09/2024  PLT 309 02/09/2024     Lab Results   Component Value Date    NA 131 (L) 02/09/2024    K 4.5 02/09/2024    CL 101 02/09/2024    ANIONGAP 13 02/09/2024    BUN 36 (H) 02/09/2024    CREATININE 1.7 (H) 02/09/2024    GLUCOSE 312 (H) 02/09/2024     Lab Results   Component Value Date    CALCIUM 8.8 02/09/2024    MG 2.4 12/15/2023     Lab Results   Component Value Date    BILITOT 0.32 05/02/2023    AST <10 05/02/2023    ALT 11 05/02/2023    ALKPHOS 81 05/02/2023    ALBUMIN 4.3 05/02/2023     Lab Results   Component Value Date    PROTIME 25.3 (H) 02/09/2024    INR 2.3 02/09/2024    APTT 38.0 (H) 12/15/2023     No results found for: BNP  No components found for: TROPONINIULT  No components found for: POCTROP        DISCHARGE DIAGNOSIS  Principal Problem:    PAF (paroxysmal atrial fibrillation) (HCC)  Resolved Problems:    * No resolved hospital problems. *      DISCHARGE MEDS     Medication List        START taking these medications      Insulin Pen Needle 32G X 4 MM Misc  Inject insulin once daily     Lantus SoloStar 100 UNIT/ML injection pen  Generic drug: insulin glargine  Inject 15 Units into the skin nightly            CONTINUE taking these medications      aspirin  81 MG EC tablet     famotidine 20 MG tablet  Commonly known as: PEPCID     Jantoven 5 MG tablet  Generic drug: warfarin     metoprolol  succinate 50 MG extended release tablet  Commonly known as: TOPROL  XL     pravastatin 20 MG tablet  Commonly known as: PRAVACHOL            STOP taking these medications      losartan-hydroCHLOROthiazide 100-12.5 MG per tablet  Commonly known as: HYZAAR     metFORMIN 1000 MG tablet  Commonly known as: GLUCOPHAGE               Where to Get Your Medications        These medications were sent to [facility] [address] [place], [place] - [address] - [place] [phone] [place] [phone]  [address] [place], [place] [address]      Phone: [phone]   Insulin Pen Needle 32G X 4 MM Misc  Lantus SoloStar 100 UNIT/ML injection pen         DISCHARGE TEACHING  Discharge instructions reviewed with patient and put in AVS    DISCHARGE PLAN:  Atrial Fibrillation s/p ablation    Resume metoprolol .  Amiodarone  has been discontinued.  Resume anticoagulation Warfarin until adequate monitoring and EP follow-up.  EP follow-up 04/19/2024 with Olivia.    Time spent on discharge: 35 minutes    PLANNED FOLLOW-UP:  Future Appointments         Provider Specialty Dept Phone    03/23/2024 10:45 AM Covert, Schuyler HERO, APRN - NP Cardiac Electrophysiology [phone]             PATIENT'S DISPOSITION:  Labs & Studies pending at the  time of discharge: None.  Code Status: Full code.  Home services required: none    Lorane Liner, FNP-BC  Cardiac Electrophysiology Nurse Practitioner  Florie Cassis [facility]  02/09/24

## 2024-02-09 NOTE — Telephone Encounter (Signed)
 Please call patient and inform he is schedule with Ronal radish for   9/15 @1 :00 pm for 30 mins virtual visit.

## 2024-02-09 NOTE — Telephone Encounter (Signed)
 9/15 @1 :00 pm for 30 mins virtual visit.

## 2024-02-10 LAB — ELECTROPHYSIOLOGY PROCEDURE: Body Surface Area: 1.94 m2

## 2024-02-10 NOTE — Telephone Encounter (Signed)
 Notified patient.

## 2024-02-13 LAB — GAD-65 AUTOANTIBODY: GAD-65 Ab: <5 U/mL (ref 0.0–5.0)

## 2024-02-14 ENCOUNTER — Encounter

## 2024-02-22 NOTE — Telephone Encounter (Signed)
 Spoke with patient and inform to increase Lantus  to 22 units. Patient stated okay.    Also sent Mychart registration via email and patient confirm he received it.

## 2024-02-22 NOTE — Telephone Encounter (Signed)
 Patient stated sugar 270. Per Protocol, Triage

## 2024-02-22 NOTE — Telephone Encounter (Signed)
 Spoke with patient and he stated he has lantus , should he switch to tresiba. Or take 22 units of lantus ?

## 2024-02-22 NOTE — Telephone Encounter (Signed)
 Sorry, Lantus  is fine. And yes 22 units

## 2024-02-22 NOTE — Telephone Encounter (Signed)
 Spoke to the patient who states that he had surgery on 02/08/2024. During the surgery they gave him a steroid injection. For two weeks his blood sugar was 355-385.[name] Patient states that the blood sugar did come down some but still only around 180-195.[name] averaging 243.. I have connected his herlene and attached the download in the media.     Patient is currently taking 15 units of Lantus       Patient denies any fever, illness or injury ( aside from surgery)  Patient admits to being severely stressed    Please advise

## 2024-02-22 NOTE — Telephone Encounter (Signed)
 Patient will like a callback at 4pm today.

## 2024-02-22 NOTE — Telephone Encounter (Signed)
 I have reviewed his freestyle [employer], glucose values still trending high and much higher after what would look like a mealtime.  Please advise the following changes until our upcoming office visit:    Increase Tresiba to 22 units once daily  Limit carbs and starchy food at meals particularly in the evening, increase water/fluid intake  May need to start short acting insulin  with his meals but need to see how blood sugars respond to increased [place]

## 2024-02-24 NOTE — Telephone Encounter (Addendum)
 Spoke with patient and gave Doctor instructions. Patient stated he will give it a couple of hours and if pain still there he will take it out and go get a finger stick meter from [employer].    Patient also stated if pain continues in other arm he will go to urgent care but he will wait a couple of hours first.    Patient stated he keeps hitting the CGM in his arm and think that is why it is hurting and irritated.

## 2024-02-24 NOTE — Telephone Encounter (Signed)
 Spoke with patient and he stated his shirt pulled out the sensor in his arm and it is now red/sore but not infected.    He placed another sensor on the opposite side and that side is hurting him. He stated he wants to keep sensor in but wants your opinion.    He wants to know if you think he just needs to get use to it?

## 2024-02-24 NOTE — Telephone Encounter (Signed)
 As I am unable to see it, need to make sure that he does not have any retained contents or fragments of monofilament in his arm particularly it is red/ sore.    My suggestion if pain continues he should be seen at urgent care for further evaluation of potential retained sensor fragment, although rare.    Side is hurting him would recommend removing it, need to make sure that he has a meter to do fingersticks in place of the sensor

## 2024-02-24 NOTE — Telephone Encounter (Signed)
 Patient is calling because Libre 3 sensor gave him a whelp in his arm and it looks infected. Patient put sensor in another arm and says it hurts and feels like a IV is stuck in his arm. Please advise

## 2024-03-05 ENCOUNTER — Telehealth
Admit: 2024-03-05 | Discharge: 2024-03-05 | Payer: BLUE CROSS/BLUE SHIELD | Attending: Nurse Practitioner | Primary: Student in an Organized Health Care Education/Training Program

## 2024-03-05 DIAGNOSIS — E1165 Type 2 diabetes mellitus with hyperglycemia: Principal | ICD-10-CM

## 2024-03-05 NOTE — Assessment & Plan Note (Signed)
 recently hospitalized for catheter ablation for A-fib, found to have A1c of 12.1%, historically poorly controlled type 2 diabetes but had not had follow-up with endocrinology in over 3 years.  Intolerance to GLP-1 therapy, and developed AKI postprocedure with creatinine at 1.7 not safe to resume metformin , ultimately was started on basal insulin     CGM data favorable in the last several weeks with improvement after Lantus  initiation    GAD antibodies were negative with opportunity to utilize oral pharmacotherapy    Outlined the following plan:  -Continue current Lantus  22 units once daily  -Update labs after visit including CMP and urine microalbumin  -If creatinine back to baseline would consider transitioning to combination metformin  and Jardiance therapy particularly if he has any evidence of elevated microalbumin  -Would recommend Synjardy dosing structure to be determined based on labs    Not amenable to any retrial of GLP-1 with previous intolerance to Trulicity requiring hospitalization from dehydration    Will see him back in 6 weeks  Referral placed to PCP to establish care

## 2024-03-05 NOTE — Progress Notes (Signed)
 Justin [name] (DOB:  [birth year]) is a Established patient, here for evaluation of the following: Diabetes    History of Present Illness      Subjective   HPI    Patient presents today for a 6-week follow-up of his diabetes after endocrine saw him inpatient for consultation of glycemic management.  Visit conducted via virtual face-to-face encounter with video teleconference.  Patient consented to visit.    In summary, we were consulted after patient was admitted for catheter ablation for A-fib under electrophysiology care, known history of diabetes and A1c had returned at 12.1% with creatinine at 1.7 and GFR at 46.  Endocrine was consulted as he was previously on metformin  but requirement to hold with AKI.  After he had significant intolerance with Trulicity and some GI intolerance with metformin , previously prescribed by endocrinology through [facility], decision was made to have him discharged on insulin  with suspicion for LADA.  He was discharged on Lantus  15 units once daily and freestyle libre CGM for glucose tracking.  GAD-65 antibody results essentially negative, however he feels much improved after starting insulin .    CGM download reviewed, average glucose at 187 with time in target 49%.  Estimated GMI equivalent at 7.8%.  He has seen more improvement over the last 2 weeks with downward trending glucose, initially blood sugars were still trending high after hospital discharge but Lantus  was increased after phone call last week and he is now on 22 units once daily.  Remains off metformin , was previously on glipizide but this stopped after he was trialed on Trulicity.    He does attest to having a fair amount of generalized anxiety, this was pertinent to discussion around CGM as he feels this has been helpful but still remains concerned about health issues in general. Previously tried Wellbutrin and Prozac, did not offer any therapeutic benefit. Has tried counseling in the past. Agreeable  to establishing with a PCP to further discuss options outside of pharmacotherapy.   Denies any SI/HI    Review of Systems     All negative except for above mentioned in HPI     Current Outpatient Medications:     losartan (COZAAR) 100 MG tablet, Take 1 tablet by mouth daily, Disp: , Rfl:     losartan (COZAAR) 100 MG tablet, Take 1 tablet by mouth, Disp: , Rfl:     Continuous Glucose Sensor (FREESTYLE LIBRE 3 SENSOR) MISC, by Does not apply route, Disp: , Rfl:     Continuous Glucose Sensor (FREESTYLE LIBRE 3 PLUS SENSOR) MISC, by Does not apply route, Disp: , Rfl:     pravastatin  (PRAVACHOL ) 20 MG tablet, Take 1 tablet by mouth daily, Disp: , Rfl:     insulin  glargine (LANTUS  SOLOSTAR) 100 UNIT/ML injection pen, Inject 15 Units into the skin nightly, Disp: 5 Adjustable Dose Pre-filled Pen Syringe, Rfl: 3    Insulin  Pen Needle 32G X 4 MM MISC, Inject insulin  once daily, Disp: 100 each, Rfl: 3    metoprolol  succinate (TOPROL  XL) 50 MG extended release tablet, Take 1 tablet by mouth daily, Disp: , Rfl:     JANTOVEN  5 MG tablet, Take 1.5 tablets by mouth daily, Disp: , Rfl:     aspirin  81 MG EC tablet, Take 1 tablet by mouth daily, Disp: , Rfl:     famotidine (PEPCID) 20 MG tablet, Take 1 tablet by mouth daily (Patient taking differently: Take 1 tablet by mouth as needed), Disp: , Rfl:  Objective   Patient-Reported Vitals  No data recorded     GENERAL APPEARANCE: well developed, well nourished, Patient is alert and oriented X 3. No acute distress. Appropriate affect. [name]   HEAD: normocephalic, atraumatic .   EYES:  extraocular movement full and smooth .   RESPIRATORY:  unlabored, regular .   SKIN: no suspicious lesions, no erythema or ecchymosis on identified areas  PSYCH:  alert, oriented, cognitive function intact, cooperative with exam, good eye contact, judgment and insight good, mood/affect appropriate .          Assessment & Plan   Below is the assessment and plan developed based on review of pertinent history,  physical exam, labs, studies, and medications.    1. Type 2 diabetes mellitus with hyperglycemia, without long-term current use of insulin  (HCC)  Assessment & Plan:    recently hospitalized for catheter ablation for A-fib, found to have A1c of 12.1%, historically poorly controlled type 2 diabetes but had not had follow-up with endocrinology in over 3 years.  Intolerance to GLP-1 therapy, and developed AKI postprocedure with creatinine at 1.7 not safe to resume metformin , ultimately was started on basal insulin     CGM data favorable in the last several weeks with improvement after Lantus  initiation    GAD antibodies were negative with opportunity to utilize oral pharmacotherapy    Outlined the following plan:  -Continue current Lantus  22 units once daily  -Update labs after visit including CMP and urine microalbumin  -If creatinine back to baseline would consider transitioning to combination metformin  and Jardiance therapy particularly if he has any evidence of elevated microalbumin  -Would recommend Synjardy dosing structure to be determined based on labs    Not amenable to any retrial of GLP-1 with previous intolerance to Trulicity requiring hospitalization from dehydration    Will see him back in 6 weeks  Referral placed to PCP to establish care  Orders:  -     [facility] - Justin Comer RAMAN, MD, [name] - [address]     Assessment & Plan       Return in about 6 weeks (around 04/16/2024).       Justin [name], was evaluated through a synchronous (real-time) audio-video encounter. The patient (or guardian if applicable) is aware that this is a billable service, which includes applicable co-pays. This Virtual Visit was conducted with patient's (and/or legal guardian's) consent. Patient identification was verified, and a caregiver was present when appropriate.   The patient was located at Home: [address]  [address]  [place] [address]  Provider was located at Home (Appt Dept State): [place]  Confirm  you are appropriately licensed, registered, or certified to deliver care in the state where the patient is located as indicated above. If you are not or unsure, please re-schedule the visit: Yes, I confirm.        --Ronal FORBES Radish, APRN - NP

## 2024-03-06 NOTE — Telephone Encounter (Signed)
 SCHEDULED PATIENT 6 WK APPOINTMENT.

## 2024-03-07 ENCOUNTER — Telehealth

## 2024-03-07 NOTE — Telephone Encounter (Signed)
 Call disconnected - did not get pharmacy infomation      Patient calling with question  Does the doctor want him to continue using sensor.  The current one runs out in a couple of days and he has no more  (Was given two in the hospital and does not have a script for more)    Please advise

## 2024-03-08 NOTE — Telephone Encounter (Signed)
 Please advise . I do not see notes on the sensor that was placed

## 2024-03-09 MED ORDER — FREESTYLE LIBRE 3 PLUS SENSOR MISC
3 refills | Status: AC
Start: 2024-03-09 — End: ?

## 2024-03-09 NOTE — Telephone Encounter (Signed)
 He can continue on CGM, I will update prescription, unsure if this was sent at hospital discharge

## 2024-03-12 NOTE — Telephone Encounter (Signed)
 Noted. Left voicemail on the patients spouse phone as his was full

## 2024-03-21 ENCOUNTER — Encounter
Attending: Student in an Organized Health Care Education/Training Program | Primary: Student in an Organized Health Care Education/Training Program

## 2024-03-23 ENCOUNTER — Ambulatory Visit
Admit: 2024-03-23 | Discharge: 2024-03-23 | Payer: BLUE CROSS/BLUE SHIELD | Attending: Cardiovascular Disease | Primary: Student in an Organized Health Care Education/Training Program

## 2024-03-23 VITALS — BP 132/68 | HR 66 | Ht 69.02 in | Wt 176.0 lb

## 2024-03-23 DIAGNOSIS — I480 Paroxysmal atrial fibrillation: Principal | ICD-10-CM

## 2024-03-23 NOTE — Progress Notes (Signed)
 "[facility] Arrhythmia / Cardiac Electrophysiology    Date of Visit:  03/23/2024      Patient name: Justin [name]  Date of Birth: 01-Mar-1966    History of Present Illness  The patient is a 58 year old male who presents for a follow-up visit after an ablation procedure.  Patient has a history of hypertension and diabetes.  His diabetes was poorly controlled although he has been recently started on insulin  and doing much better.  We plugged him in with the diabetes specialist at [facility] who is been managing him now.    He reports experiencing arrhythmia episodes 3 to 4 times over the past 2 months, each lasting only a second. These episodes are described as feeling like a jiggle. He has no issues with the groin site. His next appointment with the cardiology team is scheduled for 06/2024. He is currently on losartan without a diuretic for blood pressure management. He is also taking warfarin, which he tolerates well.    His recovery has been uneventful.  No groin pain or discomfort.  No stroke or TIA.  No chest pain.  No shortness of breath.    PAST SURGICAL HISTORY:  Ablation procedure    Other PMH/problem list, FH, SH, Allergies reviewed and updated in EPIC.  Outside records requested and reviewed.  ROS:  All other systems reviewed and are negative.    EXAM:  Vitals: BP 132/68 (BP Site: Left Upper Arm, Patient Position: Sitting, BP Cuff Size: Medium Adult)   Pulse 66   Ht 1.753 m (5' 9.02)   Wt 79.8 kg (176 lb)   BMI 25.98 kg/m   Gen: No acute distress.    Skin exam shows no obvious rash.  HEENT: Normocephalic/atraumatic.   Oral: mucous membranes moist  Neck: JVP is not elevated  Resp: Lungs are clear to auscultation bilaterally.   Cardiac: Regular rate and rhythm. No murmur, rub or gallop.  S1 and S2 are normal. Pulses are 2+ bilaterally. No carotid bruits are auscultated.  GI: Abdomen is soft and non-tender. There is no distention.   Extremities: No peripheral edema.  MSK: Normal strength  throughout.     Neuro: alert and oriented to person, place and time  Psych: mood/affect appropriate    LABS AND IMAGING: reviewed     Lab Results   Component Value Date    WBC 16.8 (H) 02/09/2024    HGB 13.3 02/09/2024    HCT 39.7 02/09/2024    MCV 81.4 (L) 02/09/2024    PLT 309 02/09/2024     Lab Results   Component Value Date    NA 131 (L) 02/09/2024    K 4.5 02/09/2024    CL 101 02/09/2024    CO2 17 (L) 02/09/2024    BUN 36 (H) 02/09/2024    CREATININE 1.7 (H) 02/09/2024    GLUCOSE 312 (H) 02/09/2024       02/08/24    TEE ANESTHESIA INTRA OP ONLY  (Final)      Reviewed medications with patient today.  Medication list up to date.  Time given to ask questions.  Answered all questions.  Instructed today to call or come back if there are any changes in health status, worsening or new symptoms.  If emergent go directly to the emergency room.     ECG in clinic personally reviewed     IMPRESSION:  1.  Paroxysmal type atrial fibrillation  2.  Hypertension  3.  Diabetes high hemoglobin A1c  4.  CKD  5.  Status post AF ablation February 08, 2024  6. CHADS VASc of 2 (HTN and diabetes)    1. Post-ablation follow-up:  - Reports experiencing brief episodes of arrhythmia, described as a jiggle, lasting only a second, which is typical and indicates the body is attempting to go into atrial fibrillation but is not succeeding.  - Groin sites are healing well.  - Currently on losartan without a diuretic for blood pressure management.  - Will continue on warfarin due to risk factors and preference over Eliquis because of cost concerns.  - Appointment with the cardiologist team scheduled for 06/2024.    2. Diabetes mellitus:  - Started on insulin  and reports doing well, with blood sugar levels around 100, though experiencing spikes at mealtimes.  - Upcoming appointment with the endocrinologist on 04/02/2024.  - Advised to get a urine test for protein, unsure of the specific type, plans to contact the doctor for clarification.    Uses  a [employer] watch to monitor has not had any sustained atrial fibrillation since his ablation.  He is on warfarin which he prefers over direct oral anticoagulant because of cost.  We talked about the risk benefit of anticoagulation longer-term he has a chads Vasc of 2 with both hypertension and diabetes I think his overall risk profile may be a little higher to with some CKD.  It is a bit of a moving target in terms of whether to stop the anticoagulation 6 months or a year out after an ablation that is apparently successful particular now they were using smart watch technology to monitor for recurrences.  Also discussed Watchman device the [facility] AHA guidelines have been the standard guide for this and recommend long-term anticoagulation based on CHA2DS2-VASc score at baseline and not on the perceived success of the ablation.  Some newer clinical trials are coming out now though looking at the safety of stopping anticoagulation so we will have more data on that.  He has had no complications from the procedure.  Recovered well.    Donalyn Schneeberger E. Maddy Graham, MD  Cardiac Electrophysiology  [facility] Arrhythmia             "

## 2024-03-29 ENCOUNTER — Telehealth

## 2024-03-29 NOTE — Telephone Encounter (Signed)
"  Patient states that he had discussed, with NP Holliday, his needing to complete labs before she would prescribe Jardiance. No labs have been ordered. He is asking for a callback once they are in. Please assist.    Phone: [phone]   "

## 2024-04-02 ENCOUNTER — Ambulatory Visit
Admit: 2024-04-02 | Discharge: 2024-04-02 | Payer: BLUE CROSS/BLUE SHIELD | Attending: Student in an Organized Health Care Education/Training Program | Primary: Student in an Organized Health Care Education/Training Program

## 2024-04-02 MED ORDER — CLONAZEPAM 0.5 MG PO TABS
0.5 | ORAL_TABLET | Freq: Every evening | ORAL | 0 refills | Status: AC | PRN
Start: 2024-04-02 — End: 2024-04-12

## 2024-04-02 NOTE — Telephone Encounter (Signed)
 Please advise on labs that you would like ordered.

## 2024-04-02 NOTE — Progress Notes (Signed)
 "Assessment & Plan  Atrial Fibrillation  He is doing well since his ablation on 02/08/2024.  Treatment plan: His current regimen of warfarin and metoprolol  will be maintained. He will continue his follow-up with electrophysiology.    Hypertension  His current treatment with losartan 100 mg and metoprolol  will be continued.    Hyperlipidemia  He will continue his current regimen of pravastatin  20 mg.    Type 2 Diabetes Mellitus  His Freestyle Libre log was reviewed today, showing some improvement. His 30-day estimated A1c is around 7.5, indicating significant improvement.  Treatment plan: His current regimen includes Lantus  15 units. He will continue his follow-up with endocrinology.    Stage 3b Chronic Kidney Disease  The possibility of introducing Jardiance was discussed. This will be further discussed with his endocrinologist during the next visit.    Anxiety  He has been experiencing worsening anxiety over the past 1-2 years, with persistent symptoms for the last 15-20 years.  Treatment plan: The use of SSRIs in conjunction with therapy was recommended for his panic attacks. A small quantity of Klonopin  will be provided monthly for situational anxiety and panic disorder.    Health Maintenance  He is due for colon cancer screening and prefers Cologuard over a colonoscopy.  Treatment plan: A Cologuard test kit will be sent to his house. Lung cancer screening was discussed, but he prefers to hold off this year.    Follow-up: The patient will follow up in 3 months.       PAF (paroxysmal atrial fibrillation) (HCC)  Primary hypertension  Hyperlipidemia, unspecified hyperlipidemia type  Type 2 diabetes mellitus with hyperglycemia, without long-term current use of insulin  (HCC)  Stage 3b chronic kidney disease (HCC)  GAD (generalized anxiety disorder)  -     clonazePAM  (KLONOPIN ) 0.5 MG tablet; Take 1 tablet by mouth nightly as needed for Anxiety for up to 10 days. Max Daily Amount: 0.5 mg, Disp-10 tablet,  R-0Normal  Screening for colorectal cancer  -     Cologuard (Fecal DNA Colorectal Cancer Screening)      Follow up and Dispositions:  Return in about 3 months (around 07/03/2024) for follow up.     CHIEF COMPLAINT:  Chief Complaint   Patient presents with    New Patient     Pt would like to discuss issues w/ anxiety         History of Present Illness  The patient is a 58 year old male who presents to establish care. He has a past medical history of atrial fibrillation and type 2 diabetes. He is following with endocrinology at [facility] for his diabetes and with electrophysiology at [facility] for his atrial fibrillation. He had an ablation on 02/08/2024 and is doing well since the procedure. He is currently on warfarin post-ablation, with discussions about potentially discontinuing anticoagulation in the future. His CHA2DS2-VASc score is 2 or 3, based on his diabetes and hypertension. For his hypertension, he is on losartan 100 mg and metoprolol . Additionally, he is on pravastatin  20 mg for cholesterol management.    Atrial Fibrillation  - He reports feeling better overall but still experiences brief episodes of atrial fibrillation 2 to 3 times daily.  - He is also on aspirin , which was prescribed by his cardiologist, Dr. Dovie, due to a 50% blockage.    Diabetes  - His diabetes, diagnosed 20 years ago, remains uncontrolled despite treatment with metformin  and glipizide.  - He has lost significant weight, dropping from 258 pounds to 170 pounds, which  he believes has helped control his blood sugar levels.  - He is also on Lantus , which keeps his blood sugar levels stable throughout the day, except after meals when they spike.  - He has been advised to add Jardiance to his regimen to protect his heart.  - He has reduced his soda intake to one diet soda per day and has made dietary changes, including eliminating rice and potatoes from his diet and focusing on protein intake.  - He occasionally consumes bread and whole wheat  pasta and tries to satisfy his sweet cravings with fruit.  - He is considering adding fiber to his diet.  - He has an upcoming appointment with his endocrinologist in 05/2024.  - Previous attempts to manage his diabetes with Trulicity resulted in dehydration and hospitalization.    Anxiety  - He has been dealing with anxiety for the past 15 to 20 years, which has worsened over the past 1 to 2 years.  - He describes his anxiety as crippling and constant, often feeling overwhelmed by even small tasks.  - He experiences physical symptoms of anxiety, such as a sensation similar to the drop on a roller coaster, and intrusive thoughts about various stressors.  - He does not take any sedatives for his anxiety but finds some relief from CBD gummies.  - He has not tried any daily anxiety medications and is interested in treatments to lower cortisol levels.  - He does not endorse feelings of depression or anger and reports no self-harm behaviors.  - He occasionally takes half a Klonopin  tablet, which provides some relief.  - He believes his high cortisol levels may be contributing to his elevated blood sugar levels.  - He has previously tried Prozac and Wellbutrin but discontinued them due to adverse effects.  - He is concerned about becoming dependent on medication and prefers situational treatment over daily medication.  - He used to take Tranxene in the 1980s and 1990s for panic attacks related to arrhythmia.    Colon Cancer Screening  - He has not undergone colon cancer screening but had a Cologuard test approximately 10 years ago, which was negative.  - He has expressed a preference against undergoing another colonoscopy.    Smoking History  - He has a history of heavy smoking, consuming up to two packs a day, but quit smoking cigarettes 5 to 6 years ago.  - He now uses a PVC vape occasionally, particularly when stressed.  - He has never undergone lung cancer screening and does not report any cough or other respiratory  symptoms.    He maintains an active lifestyle, walking 2 miles and cycling 10 miles daily.    Occupation: Tour guide  Diet: Reduced soda intake to one diet soda per day, eliminated rice and potatoes, focuses on protein intake, occasionally consumes bread and whole wheat pasta, satisfies sweet cravings with fruit  Alcohol: Does not consume alcohol  Tobacco: Quit smoking cigarettes 5 to 6 years ago, occasionally uses PVC vape  Living Condition: Lives with wife, daughter, and a friend    PAST SURGICAL HISTORY: Ablation for atrial fibrillation on 02/08/2024    Preventative healthcare  Cancer Screening:  -Colon: Due, cologuard  -Prostate ca screening: Risks and benefits discussed.   -Lung ca screening: Indicated, pt declined this year  -AAA screening: Indicated at 7  -HIV: neg  -HepC: neg  Vaccination:  - Flu: due, denies today  - COVID: due, denies today   - Prevnar 20: Due  at age 45  - Tdap: due every 10 years  - Zoster: due at age 11  - RSV: due at age 5 or 30 w/ co-morbidities      Lab Results   Component Value Date    LABA1C 12.1 (H) 02/09/2024    CREATININE 1.7 (H) 02/09/2024       I have reviewed the above labs today during our visit.        04/02/2024     2:01 PM   PHQ-9    Little interest or pleasure in doing things 1   Feeling down, depressed, or hopeless 1   PHQ-2 Score 2   PHQ-9 Total Score 2        Objective:  Vital Signs -   Vitals:    04/02/24 1358   BP: 132/82   BP Site: Left Upper Arm   Patient Position: Sitting   Weight: 82.7 kg (182 lb 6.4 oz)   Height: 1.753 m (5' 9.02)      Results  - Laboratory Studies:    - A1c: 7.6%    Physical Exam  General: Well-developed, well-nourished male in no acute distress.  Neurological: Alert and oriented to person, place, and time.  Head: Normocephalic, atraumatic.  Respiratory: Clear to auscultation, no wheezing, rales or rhonchi.  Cardiovascular: Regular rate and rhythm, no murmurs, rubs, or gallops.  Gastrointestinal: Soft, no tenderness, no distention, no  masses.    The patient (or guardian, if applicable) and other individuals in attendance with the patient were advised that Artificial Intelligence will be utilized during this visit to record, process the conversation to generate a clinical note, and support improvement of the AI technology. The patient (or guardian, if applicable) and other individuals in attendance at the appointment consented to the use of AI, including the recording.      Donnice Dec, M.D.      "

## 2024-04-03 NOTE — Telephone Encounter (Signed)
"  CMP, which was actually ordered previously by EP with active order in the system, but will update & add urine microalbumin to this assessment as well  "

## 2024-04-03 NOTE — Telephone Encounter (Signed)
"  Noted. Left voicemail advising the patient   "

## 2024-04-13 LAB — FECAL DNA COLORECTAL CANCER SCREENING (COLOGUARD): FIT-DNA (Cologuard): NEGATIVE

## 2024-05-02 NOTE — Telephone Encounter (Signed)
"   Patient informed me that he is using Lantus  22 units a night, with a fasting blood sugar range of 110-120 and a afternoon blood sugar range of 120-140 patient stated he is having midday spikes ranging between 250-300 when eating carbohydrates.  Patient was on libre 3 but informed me that his sensor came off a couple nights ago and has since deleted the corresponding app. Patient also informed me that he has been taking his metformin  2 tablets twice a day 30 minutes before meals. Please advise on any adjustments need to be made.    Spoke to patient and informed him of the [facility] From, patient assistance program and GoodRx coupons and that I will be mailing those to him.  "

## 2024-05-02 NOTE — Telephone Encounter (Signed)
"  Patient called stating he lost his medical insurance  and will have to take the urine test later. He states he can't afford the sensors he will have to prick his finger. He wants to know can he continue with Metformin  because he can't afford the Jardiance . Need to control sugar spikesPlease assist  "

## 2024-05-03 NOTE — Telephone Encounter (Addendum)
"  Please advise patient to eat low-carb low-fat relatively higher-protein diet.  Advise to continue the current dose of Lantus  22 units at night and metformin .   Based on previous records it appears that [facility] had a discussion with patient regarding [name] but this was actually never started.  He was supposed to have blood work done.  He has an upcoming appointment in December.  Advise him to have blood work done and then he can discuss regarding Jardiance with Ronal Hun at that time.  "

## 2024-05-03 NOTE — Telephone Encounter (Signed)
"  Spoke to patient informed him of the on call physician's notes below. Informed patient to get blood work done and that he will discuss jardiance with Holliday NP, at his upcoming appointment in December. Reminded patient to keep an eye out for the charity program form. Goodrx card and patient program assistance form In the mail which was sent off yesterday. Informed patient that he can still get his blood work done after filling out charity program form and submitting it while awaiting decision on said program. Informed him to continue his Lantus  22 injection at night and to continue his regiment of metformin  that he was previously doing until he sees Holliday NP.   "

## 2024-05-09 NOTE — Telephone Encounter (Signed)
"  Spoke with the patient appointment switched to virtual per [place].   "

## 2024-05-23 ENCOUNTER — Telehealth
Admit: 2024-05-23 | Discharge: 2024-05-23 | Payer: BLUE CROSS/BLUE SHIELD | Attending: Nurse Practitioner | Primary: Student in an Organized Health Care Education/Training Program

## 2024-05-23 DIAGNOSIS — E1165 Type 2 diabetes mellitus with hyperglycemia: Principal | ICD-10-CM

## 2024-05-23 NOTE — Progress Notes (Signed)
 "Justin [name] (DOB:  1966/03/12) is a Established patient, here for evaluation of the following: Type 2 diabetes    History of Present Illness      Subjective   HPI    Patient returns today for an 8-week follow-up visit of his diabetes.  Visit conducted via virtual face-to-face encounter with video teleconference.  Patient consented to visit.    In the interim of our last visit he established with new PCP to discuss issues around anxiety and treatment plan.  He also had not had PCP since his ablation back in August for A-fib.    Feels fair although much of our discussion surrounded cost prohibitive issues creating further anxiety and stress.  Lost health insurance in October, not able to afford [facility], but has been able to continue with multiple fingersticks throughout the day.  Per meter review, fasting values have ranged between 99 and 113, postprandial values upward of 139.  Highest value altogether at 161.  He has not had any hypoglycemia.  Remains on Lantus  22 units once daily.  Restarted on metformin  after he contacted the office in this provider's absence with concerns around glucose variability.  We outlined a plan to check labs including kidney function before consideration of Jardiance or restarting metformin  as creatinine had jumped to 1.7 post ablation.  He was not able to get labs prior to this visit and has not had a repeat creatinine.  Unfortunately Justin [name] has now become cost prohibitive without current insurance coverage.    Feels that his financial stress will start to ease, he had discussion on initiating SSRI treatment with  Has been better about avoiding junk food, snacks.  Started a fiber supplement to help with glucose control as well.    Remains fairly active on his job but no structured exercise program.    No issues with affordability for his cardiac meds or antihypertensives.    Review of Systems     All negative except for above mentioned in HPI     Current Outpatient  Medications:     metFORMIN  (GLUCOPHAGE ) 1000 MG tablet, Take 1 tablet by mouth 2 times daily (with meals), Disp: , Rfl:     losartan (COZAAR) 100 MG tablet, Take 1 tablet by mouth, Disp: , Rfl:     pravastatin  (PRAVACHOL ) 20 MG tablet, Take 1 tablet by mouth daily, Disp: , Rfl:     insulin  glargine (LANTUS  SOLOSTAR) 100 UNIT/ML injection pen, Inject 15 Units into the skin nightly (Patient taking differently: Inject 15 Units into the skin nightly 22 units nightly), Disp: 5 Adjustable Dose Pre-filled Pen Syringe, Rfl: 3    Insulin  Pen Needle 32G X 4 MM MISC, Inject insulin  once daily, Disp: 100 each, Rfl: 3    metoprolol  succinate (TOPROL  XL) 50 MG extended release tablet, Take 1 tablet by mouth daily, Disp: , Rfl:     JANTOVEN  5 MG tablet, Take 1.5 tablets by mouth daily, Disp: , Rfl:     aspirin  81 MG EC tablet, Take 1 tablet by mouth daily, Disp: , Rfl:     clonazePAM  (KLONOPIN ) 0.5 MG tablet, Take 1 tablet by mouth nightly as needed for Anxiety for up to 10 days. Max Daily Amount: 0.5 mg, Disp: 10 tablet, Rfl: 0    Continuous Glucose Sensor (FREESTYLE LIBRE 3 PLUS SENSOR) MISC, Use 1 every 15 days subcutaneously (Patient not taking: Reported on 05/23/2024), Disp: 6 each, Rfl: 3    Continuous Glucose Sensor (FREESTYLE LIBRE 3 SENSOR)  MISC, by Does not apply route (Patient not taking: Reported on 05/23/2024), Disp: , Rfl:     Objective   Patient-Reported Vitals  No data recorded     GENERAL APPEARANCE: well developed, well nourished, Patient is alert and oriented X 3. No acute distress. Appropriate affect. Justin [name]   HEAD: normocephalic, atraumatic .   EYES:  extraocular movement full and smooth .   RESPIRATORY:  unlabored, regular .   SKIN: no suspicious lesions, no erythema or ecchymosis on identified areas  PSYCH:  alert, oriented, cognitive function intact, cooperative with exam, good eye contact, judgment and insight good, mood/affect appropriate .          Assessment & Plan   Below is the assessment and plan developed  based on review of pertinent history, physical exam, labs, studies, and medications.    1. Type 2 diabetes mellitus with hyperglycemia, without long-term current use of insulin  (HCC)  Assessment & Plan:    no recent A1c however home fingerstick values appear to be significantly improved from baseline not seeing any reported values above 180.  Not having hypoglycemia.    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    Advised to have labs after visit today occluding chemistry, urine microalbumin and updated A1c    Continue Lantus  22 units once daily, if values dropping below 80 advised to contact endocrine and we will adjust accordingly.  Optimally would like to see him on insulin  sparing strategy although currently other pharmacotherapy options are cost prohibitive and had significant intolerance with GLP-1 therapy in the past.    Will contact him with lab results and metformin  dosing based on renal function    Encouraged vigilance with lifestyle modification efforts and food choices  Orders:  -     Comprehensive Metabolic Panel; Future  -     Hemoglobin A1C; Future  -     Albumin/Creatinine Ratio, Urine; Future  2. GAD (generalized anxiety disorder)  Assessment & Plan:    establish care with PCP, he is not interested in SSRI therapy, has prescription for clonazepam  but has not utilized.  Discussed stress reduction, hoping to see improvement with mood with decreased financial stressors  3. Stage 3b chronic kidney disease [facility])  Assessment & Plan:    see discussion above regarding recheck of renal function after visit     Assessment & Plan       Return in about 2 months (around 07/24/2024).       Justin [name], was evaluated through a  synchronous (real-time) audio-video encounter. The patient (or guardian if applicable) is aware that this is a billable service, which includes applicable co-pays. This Virtual Visit was conducted with patient's (and/or legal guardian's) consent. Patient identification was verified, and a caregiver was present when appropriate.   The patient was located at Home: [address]  [address]  [place] [address]  Provider was located at Home (Appt Dept State): [place]  Confirm you are appropriately licensed, registered, or certified to deliver care in the state where the patient is located as indicated above. If you are not or unsure, please re-schedule the visit: Yes, I confirm.        --Ronal FORBES Radish, APRN -  NP       "

## 2024-05-23 NOTE — Assessment & Plan Note (Signed)
"    see discussion above regarding recheck of renal function after visit  "

## 2024-05-23 NOTE — Assessment & Plan Note (Signed)
"    no recent A1c however home fingerstick values appear to be significantly improved from baseline not seeing any reported values above 180.  Not having hypoglycemia.    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    Advised to have labs after visit today occluding chemistry, urine microalbumin and updated A1c    Continue Lantus  22 units once daily, if values dropping below 80 advised to contact endocrine and we will adjust accordingly.  Optimally would like to see him on insulin  sparing strategy although currently other pharmacotherapy options are cost prohibitive and had significant intolerance with GLP-1 therapy in the past.    Will contact him with lab results and metformin  dosing based on renal function    Encouraged vigilance with lifestyle modification efforts and food choices  "

## 2024-05-23 NOTE — Assessment & Plan Note (Signed)
"    establish care with PCP, he is not interested in SSRI therapy, has prescription for clonazepam  but has not utilized.  Discussed stress reduction, hoping to see improvement with mood with decreased financial stressors  "

## 2024-05-28 ENCOUNTER — Inpatient Hospital Stay
Admit: 2024-05-28 | Discharge: 2024-05-28 | Payer: BLUE CROSS/BLUE SHIELD | Primary: Student in an Organized Health Care Education/Training Program

## 2024-05-28 ENCOUNTER — Encounter

## 2024-05-28 DIAGNOSIS — Z7901 Long term (current) use of anticoagulants: Principal | ICD-10-CM

## 2024-05-28 DIAGNOSIS — E1165 Type 2 diabetes mellitus with hyperglycemia: Principal | ICD-10-CM

## 2024-05-28 LAB — COMPREHENSIVE METABOLIC PANEL
ALT: 12 U/L (ref 0–42)
AST: 11 U/L (ref 0–46)
Albumin/Globulin Ratio: 1.96 (ref 1.00–2.70)
Albumin: 4.7 g/dL (ref 3.5–5.2)
Alk Phosphatase: 80 U/L (ref 40–130)
Anion Gap: 10 mmol/L (ref 2–17)
BUN: 22 mg/dL — ABNORMAL HIGH (ref 6–20)
CALCIUM,CORRECTED,CCA: 9.3 mg/dL (ref 8.5–10.7)
CO2: 24 mmol/L (ref 22–29)
Calcium: 9.8 mg/dL (ref 8.5–10.7)
Chloride: 105 mmol/L (ref 98–107)
Creatinine: 1.1 mg/dL (ref 0.7–1.3)
Est, Glom Filt Rate: 78 mL/min/1.73mÂ² (ref >=60)
Globulin: 2.4 g/dL (ref 1.9–4.4)
Glucose: 133 mg/dL — ABNORMAL HIGH (ref 70–99)
Osmolaliy Calculated: 283 mosm/kg (ref 270–287)
Potassium: 4.6 mmol/L (ref 3.5–5.3)
Sodium: 139 mmol/L (ref 135–145)
Total Bilirubin: 0.28 mg/dL (ref 0.00–1.20)
Total Protein: 7.1 g/dL (ref 5.7–8.3)

## 2024-05-28 LAB — PROTIME-INR
INR: 1.1 — ABNORMAL LOW (ref 1.5–3.5)
Protime: 14.4 s (ref 11.6–14.5)

## 2024-05-28 LAB — HEMOGLOBIN A1C
Estimated Avg Glucose: 160
Estimated Avg Glucose: 179
Hemoglobin A1C: 7.2 % — ABNORMAL HIGH (ref 4.0–6.0)

## 2024-05-29 NOTE — Telephone Encounter (Signed)
"-----   Message from Ronal Radish, APRN - NP sent at 05/28/2024 11:24 AM EST -----  Please call patient, advise that A1c has improved significantly down to 7.2%.  His kidney function is also back to baseline, normal creatinine okay to continue with metformin  dosing  ----- Message -----  From: Edi, [facility]  Sent: 05/28/2024  11:19 AM EST  To: Ronal FORBES Radish, APRN - NP    "

## 2024-05-29 NOTE — Telephone Encounter (Signed)
"  Spoke to patient and updated him on lab result per [facility] NP.  Patient understood.   "

## 2024-06-04 NOTE — Telephone Encounter (Signed)
"  SPOKE WITH PT. SCHEDULE 2 MO VV FU 08/13/24 2:15 PM   "

## 2024-07-04 ENCOUNTER — Encounter
Attending: Student in an Organized Health Care Education/Training Program | Primary: Student in an Organized Health Care Education/Training Program
